# Patient Record
Sex: Male | Born: 1965 | Race: Black or African American | Hispanic: No | Marital: Single | Smoking: Former smoker
Health system: Southern US, Community
[De-identification: ages and names within clinical notes are randomized; demographics above are authoritative.]

## PROBLEM LIST (undated history)

## (undated) DIAGNOSIS — E119 Type 2 diabetes mellitus without complications: Secondary | ICD-10-CM

## (undated) DIAGNOSIS — B1920 Unspecified viral hepatitis C without hepatic coma: Secondary | ICD-10-CM

## (undated) DIAGNOSIS — I10 Essential (primary) hypertension: Secondary | ICD-10-CM

## (undated) DIAGNOSIS — C801 Malignant (primary) neoplasm, unspecified: Secondary | ICD-10-CM

## (undated) DIAGNOSIS — D649 Anemia, unspecified: Secondary | ICD-10-CM

## (undated) MED FILL — Dexamethasone Sodium Phosphate Inj 100 MG/10ML: INTRAMUSCULAR | Qty: 1 | Status: AC

---

## 2021-02-13 ENCOUNTER — Emergency Department (HOSPITAL_COMMUNITY): Payer: Self-pay

## 2021-02-13 ENCOUNTER — Other Ambulatory Visit: Payer: Self-pay

## 2021-02-13 ENCOUNTER — Telehealth: Payer: Self-pay | Admitting: Surgery

## 2021-02-13 ENCOUNTER — Encounter (HOSPITAL_COMMUNITY): Payer: Self-pay

## 2021-02-13 ENCOUNTER — Emergency Department (HOSPITAL_COMMUNITY)
Admission: EM | Admit: 2021-02-13 | Discharge: 2021-02-13 | Disposition: A | Discharge status: Home / Self Care | Payer: Self-pay | Attending: Student | Admitting: Student

## 2021-02-13 DIAGNOSIS — M545 Low back pain, unspecified: Secondary | ICD-10-CM | POA: Insufficient documentation

## 2021-02-13 DIAGNOSIS — C61 Malignant neoplasm of prostate: Secondary | ICD-10-CM | POA: Insufficient documentation

## 2021-02-13 LAB — CBC WITH DIFFERENTIAL/PLATELET
Abs Immature Granulocytes: 0.33 10*3/uL — ABNORMAL HIGH (ref 0.00–0.07)
Basophils Absolute: 0 10*3/uL (ref 0.0–0.1)
Basophils Relative: 1 %
Eosinophils Absolute: 0.1 10*3/uL (ref 0.0–0.5)
Eosinophils Relative: 1 %
HCT: 23.1 % — ABNORMAL LOW (ref 39.0–52.0)
Hemoglobin: 7.2 g/dL — ABNORMAL LOW (ref 13.0–17.0)
Immature Granulocytes: 4 %
Lymphocytes Relative: 19 %
Lymphs Abs: 1.6 10*3/uL (ref 0.7–4.0)
MCH: 25.7 pg — ABNORMAL LOW (ref 26.0–34.0)
MCHC: 31.2 g/dL (ref 30.0–36.0)
MCV: 82.5 fL (ref 80.0–100.0)
Monocytes Absolute: 0.8 10*3/uL (ref 0.1–1.0)
Monocytes Relative: 10 %
Neutro Abs: 5.2 10*3/uL (ref 1.7–7.7)
Neutrophils Relative %: 65 %
Platelets: 396 10*3/uL (ref 150–400)
RBC: 2.8 MIL/uL — ABNORMAL LOW (ref 4.22–5.81)
RDW: 14.3 % (ref 11.5–15.5)
WBC: 8 10*3/uL (ref 4.0–10.5)
nRBC: 0 % (ref 0.0–0.2)

## 2021-02-13 LAB — COMPREHENSIVE METABOLIC PANEL
ALT: 9 U/L (ref 0–44)
AST: 41 U/L (ref 15–41)
Albumin: 3.6 g/dL (ref 3.5–5.0)
Alkaline Phosphatase: 644 U/L — ABNORMAL HIGH (ref 38–126)
Anion gap: 6 (ref 5–15)
BUN: 40 mg/dL — ABNORMAL HIGH (ref 6–20)
CO2: 27 mmol/L (ref 22–32)
Calcium: 9 mg/dL (ref 8.9–10.3)
Chloride: 107 mmol/L (ref 98–111)
Creatinine, Ser: 1.56 mg/dL — ABNORMAL HIGH (ref 0.61–1.24)
GFR, Estimated: 51 mL/min — ABNORMAL LOW (ref >60)
Glucose, Bld: 116 mg/dL — ABNORMAL HIGH (ref 70–99)
Potassium: 3.5 mmol/L (ref 3.5–5.1)
Sodium: 140 mmol/L (ref 135–145)
Total Bilirubin: 0.6 mg/dL (ref 0.3–1.2)
Total Protein: 7.6 g/dL (ref 6.5–8.1)

## 2021-02-13 LAB — PSA: Prostatic Specific Antigen: 1234.14 ng/mL — ABNORMAL HIGH (ref 0.00–4.00)

## 2021-02-13 LAB — LACTATE DEHYDROGENASE: LDH: 351 U/L — ABNORMAL HIGH (ref 98–192)

## 2021-02-13 MED ORDER — FERROUS SULFATE 325 (65 FE) MG PO TABS: 325.0000 mg | ORAL_TABLET | Freq: Every day | ORAL | 0 refills | Status: AC

## 2021-02-13 MED ORDER — IOHEXOL 350 MG/ML SOLN
80.0000 mL | Freq: Once | INTRAVENOUS | Status: AC | PRN
  Administered 2021-02-13: 80 mL via INTRAVENOUS

## 2021-02-13 NOTE — ED Provider Notes (Signed)
[facility] DEPT Provider Note   CSN: [id number] Arrival date & time: 02/13/21  1122     History Chief Complaint  Patient presents with   Back Pain    Kunio Cummiskey is a 55 y.o. male.  HPI  55 year old male presents the emergency department today for evaluation of lower back pain.  Pain is located to the bilateral lower back and radiates down the bilateral lower extremities.  This is been present for several months.  He has been taking over-the-counter medications with some improvement of symptoms.  He denies any associated numbness, weakness, incontinence.  Denies any fevers, history of cancer or IV drug use.  He denies any chest pain, urinary symptoms, abdominal pain.  History reviewed. No pertinent past medical history.  There are no problems to display for this patient.   History reviewed. No pertinent surgical history.     Family History  Family history unknown: Yes    Social History   Tobacco Use   Smoking status: Never   Smokeless tobacco: Never  Vaping Use   Vaping Use: Never used  Substance Use Topics   Alcohol use: Never   Drug use: Yes    Types: Marijuana    Home Medications Prior to Admission medications   Medication Sig Start Date End Date Taking? Authorizing Provider  ferrous sulfate 325 (65 FE) MG tablet Take 1 tablet (325 mg total) by mouth daily. Start by taking one tablet every other day. Over the next 2 weeks increase your dose to once daily. 02/13/21  Yes Dashay Giesler S, PA-C    Allergies    Patient has no known allergies.  Review of Systems   Review of Systems  Constitutional:  Negative for fever.  Respiratory:  Negative for shortness of breath.   Cardiovascular:  Negative for chest pain.  Gastrointestinal:  Negative for abdominal pain.       No incontinence  Genitourinary:  Negative for dysuria and frequency.       No incontinence  Musculoskeletal:  Positive for back pain.  Neurological:   Negative for weakness and numbness.   Physical Exam Updated Vital Signs BP (!) 163/92   Pulse 92   Temp 98 F (36.7 C) (Oral)   Resp 17   Ht 6\' 3"  (1.905 m)   Wt 81.2 kg   SpO2 100%   BMI 22.37 kg/m   Physical Exam Constitutional:      General: He is not in acute distress.    Appearance: He is well-developed.  Eyes:     Conjunctiva/sclera: Conjunctivae normal.  Cardiovascular:     Rate and Rhythm: Normal rate and regular rhythm.  Pulmonary:     Effort: Pulmonary effort is normal.     Breath sounds: Normal breath sounds.  Abdominal:     General: Bowel sounds are normal.     Palpations: Abdomen is soft.     Tenderness: There is no abdominal tenderness.  Musculoskeletal:     Comments: No midline lumbar TTP. TTP noted to the bilat SI joints. 5/5 strength to the bilat lower extremities with normal sensation throughout  Skin:    General: Skin is warm and dry.  Neurological:     Mental Status: He is alert and oriented to person, place, and time.    ED Results / Procedures / Treatments   Labs (all labs ordered are listed, but only abnormal results are displayed) Labs Reviewed  CBC WITH DIFFERENTIAL/PLATELET - Abnormal; Notable for the following components:  Result Value   RBC 2.80 (*)    Hemoglobin 7.2 (*)    HCT 23.1 (*)    MCH 25.7 (*)    Abs Immature Granulocytes 0.33 (*)    All other components within normal limits  COMPREHENSIVE METABOLIC PANEL - Abnormal; Notable for the following components:   Glucose, Bld 116 (*)    BUN 40 (*)    Creatinine, Ser 1.56 (*)    Alkaline Phosphatase 644 (*)    GFR, Estimated 51 (*)    All other components within normal limits  LACTATE DEHYDROGENASE - Abnormal; Notable for the following components:   LDH 351 (*)    All other components within normal limits  PSA - Abnormal; Notable for the following components:   Prostatic Specific Antigen 1,234.14 (*)    All other components within normal limits  CEA  CANCER ANTIGEN 19-9   TESTOSTERONE, FREE, DIRECT    EKG None  Radiology DG Lumbar Spine Complete  Result Date: 02/13/2021 CLINICAL DATA:  Back pain EXAM: LUMBAR SPINE - COMPLETE 4+ VIEW COMPARISON:  None. FINDINGS: Mild narrowing of the L3-4 interspace. Moderate degenerative disc disease with vacuum phenomenon L5-S1. Normal alignment. No fracture. Sclerotic lesions in the left iliac wing and left sacral ala. IMPRESSION: 1. Negative for fracture or other acute bone abnormality. 2. Degenerative disc disease L3-4 and L5-S1 as above. 3. Sclerotic left iliac wing and right sacral lesions, possible benign bone islands but nonspecific. The Electronically Signed   By: Lucrezia Europe M.D.   On: 02/13/2021 12:44   CT PELVIS WO CONTRAST  Result Date: 02/13/2021 CLINICAL DATA:  Bilateral low back pain radiating down both legs for the past 4 months. EXAM: CT PELVIS WITHOUT CONTRAST TECHNIQUE: Multidetector CT imaging of the pelvis was performed following the standard protocol without intravenous contrast. COMPARISON:  Bilateral hip x-rays from same day. FINDINGS: Urinary Tract: Incompletely imaged severe right and moderate left hydroureter with abrupt cut off in the pelvis. Mild circumferential bladder wall thickening. Bowel:  Unremarkable visualized pelvic bowel loops. Vascular/Lymphatic: Enlarged right external iliac lymph nodes measuring up to 1.6 cm in short axis. Reproductive: Enlarged, somewhat irregular prostate gland with ill-defined bilateral seminal vesicles. Other:  None. Musculoskeletal: Multiple ill-defined sclerotic lesions in the visualized pelvis. Ill-defined lytic lesion in the L4 vertebral body mild pathologic superior endplate compression fracture. IMPRESSION: 1. Constellation of findings most consistent with metastatic prostate cancer. Local invasion of the seminal vesicles and bilateral ureteral obstruction. 2. Right external iliac nodal metastatic disease. Diffuse pelvic osseous metastatic disease. 3. Mild  pathologic superior endplate compression fracture at L4. Electronically Signed   By: Titus Dubin M.D.   On: 02/13/2021 14:37   CT ABDOMEN PELVIS W CONTRAST  Result Date: 02/13/2021 CLINICAL DATA:  Staging prostate cancer.  Weight loss. EXAM: CT ABDOMEN AND PELVIS WITH CONTRAST TECHNIQUE: Multidetector CT imaging of the abdomen and pelvis was performed using the standard protocol following bolus administration of intravenous contrast. CONTRAST:  33mL OMNIPAQUE IOHEXOL 350 MG/ML SOLN COMPARISON:  Pelvic CT scan 02/13/2021. FINDINGS: Lower chest: Few small pulmonary nodules are noted in the lung bases. The largest measures 5 mm on image 15/4. Findings certainly suspicious pulmonary metastatic disease. No pleural effusions. No pericardial effusion. Hepatobiliary: There are a few small ill-defined low-attenuation hepatic lesions which are indeterminate but somewhat suspicious for metastatic disease. MRI may be helpful for further evaluation if clinically necessary. No intrahepatic biliary dilatation. The gallbladder is unremarkable. No common bile duct dilatation. Pancreas: No mass, inflammation or  ductal dilatation. Spleen: Normal size.  No focal lesions. Adrenals/Urinary Tract: Adrenal glands are normal. Severe bilateral hydroureteronephrosis. Both ureters are obstructed in the pelvis the distally and likely involved with prostate cancer. No worrisome renal lesions. Small lower pole left renal calculus noted. Mild uniform bladder wall thickening.  No bladder mass or calculi. Stomach/Bowel: The stomach, duodenum, small bowel and colon are grossly normal. Vascular/Lymphatic: Age advanced atherosclerotic calcifications involving the aorta and iliac arteries. Retroperitoneal lymphadenopathy surrounding the aorta. The largest node on the left side measures 2.2 cm on image 24/2. Inter aortocaval lymph node on image 26/2 measures 15 mm. Several smaller nodes down along the iliac chains. Bilateral pelvic adenopathy  the. The largest right-sided node measures 22 mm on image 60/2 in the largest left node measures 12 mm on image 61/2. Reproductive: Markedly enlarged prostate gland measuring 8 cm. Other: No inguinal mass or inguinal adenopathy. No free pelvic fluid collections. Musculoskeletal: Diffuse sclerotic osseous metastatic disease mainly involving the bony pelvis and the lumbar spine. Pathologic fracture of L4 is noted. Small bilateral hip lesions are also noted. IMPRESSION: 1. Markedly enlarged prostate gland with metastatic pelvic and retroperitoneal lymphadenopathy. 2. Diffuse sclerotic osseous metastatic disease mainly involving the bony pelvis and the lumbar spine. Pathologic fracture of L4. 3. Small ill-defined low-attenuation hepatic lesions are indeterminate but suspicious for metastatic disease. 4. Severe bilateral hydroureteronephrosis likely due to involvement of the distal ureters by the prostate cancer. 5. Small pulmonary nodules at the lung bases certainly suspicious for pulmonary metastatic disease. 6. Age advanced atherosclerotic calcifications involving the aorta and iliac arteries. Aortic Atherosclerosis (ICD10-I70.0). Electronically Signed   By: Marijo Sanes M.D.   On: 02/13/2021 18:19   DG Hips Bilat W or Wo Pelvis 3-4 Views  Result Date: 02/13/2021 CLINICAL DATA:  Low back pain EXAM: DG HIP (WITH OR WITHOUT PELVIS) 3-4V BILAT COMPARISON:  None. FINDINGS: There is no evidence of hip fracture or dislocation. Early bilateral hip DJD. Sclerotic foci in the left iliac wing and left sacral ala . IMPRESSION: 1. Negative for fracture or acute bone abnormality. 2. Early bilateral hip DJD. 3. Sclerotic left iliac and sacral lesions possibly benign bone islands but nonspecific. Electronically Signed   By: Lucrezia Europe M.D.   On: 02/13/2021 13:06    Procedures Procedures   Medications Ordered in ED Medications  iohexol (OMNIPAQUE) 350 MG/ML injection 80 mL (80 mLs Intravenous Contrast Given 02/13/21  1717)    ED Course  I have reviewed the triage vital signs and the nursing notes.  Pertinent labs & imaging results that were available during my care of the patient were reviewed by me and considered in my medical decision making (see chart for details).    MDM Rules/Calculators/A&P                          Patient with back pain.  No neurological deficits and normal neuro exam.  Patient can walk but states is painful.  No loss of bowel or bladder control.  No concern for cauda equina.  No fever, night sweats, weight loss, h/o cancer, IVDU.  \\\  Given duration of sxs decision made to pursue imaging  Reviewed/interpreted imaging Xray lumbar spine/pelvis - 1. Negative for fracture or acute bone abnormality. 2. Early bilateral hip DJD. 3. Sclerotic left iliac and sacral lesions possibly benign bone islands but nonspecific. - due to nonspecific nature of abnormal bone findings, CT pelvis ordered for further characterization of the  lesions CT pelvis - 1. Constellation of findings most consistent with metastatic prostate cancer. Local invasion of the seminal vesicles and bilateral ureteral obstruction. 2. Right external iliac nodal metastatic disease. Diffuse pelvic osseous metastatic disease. 3. Mild pathologic superior endplate compression fracture at L4.  3:30 PM CONSULT with Dr. Junious Silk with urology. He recommends labs. If abnormal creatinine, then recommends CT abd/pelvis and admission. If labwork is reassuring, then he can f/u with outpatient urology.  CBC with anemia that is likely chronic though there is no other for comparison CMP with normal lfts, mildly elevated bun/cr.   CT abd/pelvis - 1. Markedly enlarged prostate gland with metastatic pelvic and retroperitoneal lymphadenopathy. 2. Diffuse sclerotic osseous metastatic disease mainly involving the bony pelvis and the lumbar spine. Pathologic fracture of L4. 3. Small ill-defined low-attenuation hepatic lesions are indeterminate but  suspicious for metastatic disease. 4. Severe bilateral hydroureteronephrosis likely due to involvement of the distal ureters by the prostate cancer. 5. Small pulmonary nodules at the lung bases certainly suspicious for pulmonary metastatic disease. 6. Age advanced atherosclerotic calcifications involving the aorta and iliac arteries. Aortic Atherosclerosis  6:42 PM CONSULT with Dr. Junious Silk who advises that if patient is admitted he will consult however it is reasonable to discharge patient with outpatient f/u given he is able to pass urine and has no other emergent lab values that warrant admission.   7:00 PM CONSULT with Dr. Lindi Adie who will have the [facility] reach out to the patient to schedule a follow up appointment   MDM: pt with back pain who was ultimately diagnosed with suspected prostate ca with mets likely to bone and potentially to the liver and lungs. Oncology and urology have been consulted and pt prefers to be discharged to f/u up. He was offered admission however he declined. I will started him on iron supplements for his anemia. Have advised on plan for f/u and advised on strict return precautions. He voices understating and is in agreement. All questions answered, pt stable for discharge.  Final Clinical Impression(s) / ED Diagnoses Final diagnoses:  Prostate cancer [facility])    Rx / DC Orders ED Discharge Orders          Ordered    ferrous sulfate 325 (65 FE) MG tablet  Daily        02/13/21 2014             Bishop Dublin 02/13/21 2017    Teressa Lower, MD 02/14/21 1815

## 2021-02-13 NOTE — Discharge Instructions (Addendum)
You will need to call [facility] on Monday to schedule an appointment for follow-up.  They will see you in the office next week.  Additionally I have referred you to the cancer center.  The cancer center will reach out to you to schedule an appointment.  Please monitor your symptoms closely, if you have any new or worsening symptoms in the meantime including any difficulty urinating, fevers or other concerning symptoms then please return to the emergency department immediately.

## 2021-02-13 NOTE — ED Notes (Signed)
Patient transported to CT 

## 2021-02-13 NOTE — ED Triage Notes (Signed)
Patient reports that he has bilateral low back pain that radiates down both legs x 4 months. 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

## 2021-02-15 ENCOUNTER — Telehealth: Payer: Self-pay | Admitting: Surgery

## 2021-02-15 NOTE — Telephone Encounter (Signed)
Received TOC consult  from EDP  concerning care coordination for PCP/ [facility] , on  ED evaluation new diagnosis prostate cancer. RNCM Contacted patient to discuss assistance with PCP and [facility], discussed [facility].  RNCM noted that Dr. Lindi Adie at [facility] have been consulted and will have scheduler reach out to patient to arrange an appointment.  Referral has been sent to [facility] internal Medicine [facility].  RNCM will follow up with patient tomorrow evening.

## 2021-02-16 LAB — CEA: CEA: 3 ng/mL (ref 0.0–4.7)

## 2021-02-16 LAB — CANCER ANTIGEN 19-9: CA 19-9: 8 U/mL (ref 0–35)

## 2021-02-17 ENCOUNTER — Telehealth: Payer: Self-pay | Admitting: Oncology

## 2021-02-17 ENCOUNTER — Other Ambulatory Visit: Payer: Self-pay

## 2021-02-17 ENCOUNTER — Telehealth: Payer: Self-pay | Admitting: Surgery

## 2021-02-17 DIAGNOSIS — C61 Malignant neoplasm of prostate: Secondary | ICD-10-CM

## 2021-02-17 LAB — TESTOSTERONE, FREE, DIRECT
Testosterone, Free: 4.5 pg/mL — ABNORMAL LOW (ref 7.2–24.0)
Testosterone, Total, LC/MS: 221.6 ng/dL — ABNORMAL LOW (ref 264.0–916.0)

## 2021-02-17 NOTE — Telephone Encounter (Signed)
Scheduled appt per 11/1 staff msg. Pt is aware of appt date and time and to arrive 15 mins prior to appt.

## 2021-02-17 NOTE — Telephone Encounter (Addendum)
ED RNCM contacted [facility] to set up follow up appointment and was informed that patient would have to bring $250 for initial visit.   Patient was not able to afford the initial cost. CM called [facility] at [facility] and was able to get an appointment scheduled for  ED follow up  Patient was updated with appointment information.  Patient is scheduled to establish care with [facility] internal Medicine Clinic 11/2.  No further ED RNCM needs identidified.

## 2021-02-18 ENCOUNTER — Ambulatory Visit: Payer: Self-pay | Admitting: Internal Medicine

## 2021-02-18 ENCOUNTER — Encounter: Payer: Self-pay | Admitting: Internal Medicine

## 2021-02-18 ENCOUNTER — Other Ambulatory Visit: Payer: Self-pay

## 2021-02-18 VITALS — BP 163/98 | HR 97 | Temp 98.1°F | Ht 75.0 in | Wt 160.0 lb

## 2021-02-18 DIAGNOSIS — Z8619 Personal history of other infectious and parasitic diseases: Secondary | ICD-10-CM | POA: Insufficient documentation

## 2021-02-18 DIAGNOSIS — I10 Essential (primary) hypertension: Secondary | ICD-10-CM

## 2021-02-18 DIAGNOSIS — E119 Type 2 diabetes mellitus without complications: Secondary | ICD-10-CM | POA: Insufficient documentation

## 2021-02-18 DIAGNOSIS — D649 Anemia, unspecified: Secondary | ICD-10-CM | POA: Insufficient documentation

## 2021-02-18 DIAGNOSIS — N179 Acute kidney failure, unspecified: Secondary | ICD-10-CM

## 2021-02-18 DIAGNOSIS — N131 Hydronephrosis with ureteral stricture, not elsewhere classified: Secondary | ICD-10-CM

## 2021-02-18 DIAGNOSIS — G8929 Other chronic pain: Secondary | ICD-10-CM | POA: Insufficient documentation

## 2021-02-18 DIAGNOSIS — M545 Low back pain, unspecified: Secondary | ICD-10-CM

## 2021-02-18 DIAGNOSIS — C61 Malignant neoplasm of prostate: Secondary | ICD-10-CM | POA: Insufficient documentation

## 2021-02-18 LAB — GLUCOSE, CAPILLARY: Glucose-Capillary: 97 mg/dL (ref 70–99)

## 2021-02-18 LAB — POCT GLYCOSYLATED HEMOGLOBIN (HGB A1C): Hemoglobin A1C: 6.8 % — AB (ref 4.0–5.6)

## 2021-02-18 NOTE — Assessment & Plan Note (Addendum)
History of uncomplicated Type 2 Diabetes dating back to 2016. He was previously treated with Metformin and Glipizide but was intolerant to Metformin due to side effects (N/V/D). At this time, he denies any frequent urination, excessive thirst. A1c today is within goal at 6.8%.   - Repeat A1c in 3-6 months  - A1c goal of 8-9%

## 2021-02-18 NOTE — Assessment & Plan Note (Addendum)
Lab worked obtained at recent ED visit demonstrated a creatinine of 1.5 with BUN of 39. Previously lab work demonstrated normal renal function. Differential includes secondary to hydronephrosis versus secondary to long standing HTN.   - Repeat BMP pending today   ADDENDUM: Creatinine improving. Will need to monitor closely as long as patient is retaining, however no need for foley at this time based on renal function

## 2021-02-18 NOTE — Assessment & Plan Note (Addendum)
BP Readings from Last 3 Encounters:  02/18/21 (!) 163/98  02/13/21 (!) 163/92   Shane Jackson has a PMHx of HTN previously treated with Lisinopril-HCTZ. He notes that he developed N/V/D when Metformin was also started at the same. He wasn't sure which medication caused it so he stopped take it after a while. He denies any SOB, chest pain (at rest or with exertion), lower extremity edema. He denies any current tobacco use or family history of heart disease.   Assessment/Plan:  Asymptomatic HTN today. Given recent diagnosis of advanced metastatic cancer complicated by severe bilateral hydroureteronephrosisand AKI, will hold off on initiating treatment as it would likely affect renal function.   - Continue to monitor closely - Counseled on return precautions

## 2021-02-18 NOTE — Progress Notes (Signed)
CC: Establish Care; T2DM; HTN; Prostate Cancer   HPI:  [name] is a 55 y.o. with a PMHx as listed below who presents to the clinic for Establish Care; T2DM; HTN; Prostate Cancer.   Please see the Encounters tab for problem-based Assessment & Plan regarding status of patient's acute and chronic conditions.  Past Medical History:  HTN T2DM Hepatitis C Genotype 1b Metastatic Prostate cancer (recent diagnosis)  Past Surgical History:  None   Family History:  No family history of any malignancy  Brother with HTN No family history of diabetes   Social History:  Lives in [place] and independent in all ADLs and IADLs Denies any current or previous tobacco, drug or alcohol use  Social support includes his family, however both his parents are deceased.   Review of Systems: Review of Systems  Constitutional:  Positive for weight loss. Negative for chills, fever and malaise/fatigue.  HENT:  Negative for congestion and sore throat.   Eyes:  Negative for blurred vision and double vision.  Respiratory:  Negative for shortness of breath and wheezing.   Cardiovascular:  Negative for chest pain, palpitations and leg swelling.  Gastrointestinal:  Positive for melena (one dark (not black) stool last week). Negative for abdominal pain, constipation, diarrhea, nausea and vomiting.  Genitourinary:  Negative for dysuria, flank pain, frequency, hematuria and urgency.       No straining with urination  Musculoskeletal:  Positive for back pain and myalgias. Negative for falls, joint pain and neck pain.  Skin:  Negative for itching and rash.  Neurological:  Negative for dizziness, focal weakness, weakness and headaches.  Psychiatric/Behavioral:  Negative for depression, substance abuse and suicidal ideas. The patient is not nervous/anxious and does not have insomnia.    Physical Exam:  Vitals:   02/18/21 1315  BP: (!) 163/98  Pulse: 97  Temp: 98.1 F (36.7 C)  TempSrc: Oral   SpO2: 100%  Weight: 160 lb (72.6 kg)  Height: 6\' 3"  (1.905 m)   Physical Exam Constitutional:      General: He is not in acute distress.    Appearance: He is normal weight.  HENT:     Head: Normocephalic and atraumatic.     Mouth/Throat:     Mouth: Mucous membranes are moist.     Pharynx: Oropharynx is clear.  Eyes:     Extraocular Movements: Extraocular movements intact.     Conjunctiva/sclera: Conjunctivae normal.     Pupils: Pupils are equal, round, and reactive to light.  Cardiovascular:     Rate and Rhythm: Normal rate and regular rhythm.     Heart sounds: No murmur heard.   No gallop.  Pulmonary:     Effort: Pulmonary effort is normal. No respiratory distress.     Breath sounds: Normal breath sounds. No wheezing, rhonchi or rales.  Abdominal:     General: Abdomen is scaphoid. Bowel sounds are normal. There is no distension.     Palpations: Abdomen is soft. There is no mass.     Tenderness: There is no abdominal tenderness. There is no right CVA tenderness, left CVA tenderness or guarding.  Musculoskeletal:        General: No signs of injury.     Right lower leg: No edema.     Left lower leg: No edema.  Skin:    General: Skin is warm and dry.     Coloration: Skin is not jaundiced or pale.     Findings: No erythema or rash.  Neurological:     Mental Status: He is alert and oriented to person, place, and time. Mental status is at baseline.     Sensory: No sensory deficit.     Motor: No weakness.     Coordination: Coordination normal.     Gait: Gait normal.  Psychiatric:        Mood and Affect: Mood normal.        Behavior: Behavior normal.        Thought Content: Thought content normal.        Judgment: Judgment normal.    Assessment & Plan:   See Encounters Tab for problem based charting.  Patient discussed with Dr. Evette Doffing

## 2021-02-18 NOTE — Assessment & Plan Note (Addendum)
Previous history of uncomplicated Hepatitis C treated with Harvoni for 8 weeks, completed in 02/2015. No post-treatment RNA quant obtained to ensure SVR. Given potential need for immunosuppressive agents in the future given new diagnosis of cancer, will obtain quant today.   - HCV RNA quant pending  ADDENDUM: HCV RNA undetectable consistent with SVR.

## 2021-02-18 NOTE — Assessment & Plan Note (Addendum)
Shane Jackson denies any dizziness, palpitations, difficulty breathing. He denies any hematuria, hematochezia, hematemesis. He did notice one dark bowel movement last week but states it was not black, just darker than usual. He has not started daily iron supplements yet as was recommended in the ED but plans to do so today.   Assessment/Plan: Asymptomatic severe normocytic anemia. Suspect in the setting of malignancy with bony involvement.   - Repeat CBC to trend hemoglobin today - Ferritin, iron panel pending  - Recommended continued daily OTC iron supplementation   ADDENDUM: Hemoglobin remained stable at 7.3. Based on iron panel, suspect this is secondary to bony infiltration and anemia of chronic disease. Counseled to discontinue iron supplementation for now.

## 2021-02-18 NOTE — Assessment & Plan Note (Signed)
Mr. Shane Jackson states he has lower back pain for the last 4 months. He is not worse during a particular part of the day and essentially all positions aggravate his pain. He denies any focal leg weakness or numbness but occasionally he feels like his muscles are tight. He denies any difficulty with walking or performing his ADLs secondary to pain. Pain is described as more of a nuisance.   Assessment/Plan: Chronic back pain secondary to metastatic disease with L4 fracture seen on imaging. No evidence of spinal involvement on examination. Will treat conservatively for now.   - Recommended Tylenol PRN for pain

## 2021-02-18 NOTE — Assessment & Plan Note (Signed)
Shane Jackson denies any known previous history of prostate cancer or family history of prostate cancer.   Assessment/Plan:  Recent ED visit for back pain with results significant for advanced prostate cancer with metastasis to the bone, liver and lung. PSA elevated > 1200. Oncology and urology appointments are set.   - Follow up Oncology recommendations

## 2021-02-18 NOTE — Patient Instructions (Addendum)
It was nice seeing you today! Thank you for choosing [facility] for your Primary Care.    Today we talked about:   Diabetes: We will check your A1c today to see how your diabetes is doing. Our goal A1c for you is 8-9%. If below this, we will not start treatment for now.   High blood pressure: Your blood pressure was a bit high today. We will hold off on starting treatment for now.   I am checking your Hepatitis C levels to ensure treatment was successful.   Back Pain:  I recommend Tylenol 1000 mg every 6 to 8 hours, as needed for back pain. If this is not successful for you, please let me know.   Prostate Cancer:  I am checking blood work regarding your kidneys and blood counts. I will call you with the results If you cannot urinate and feel the need to, please contact our office or the urology office immediately.

## 2021-02-18 NOTE — Assessment & Plan Note (Signed)
Shane Jackson denies any difficult urinating or interrupted urine stream. He denies any straining with urination.   Assessment/Plan: Recent CT imaging with severe bilateral hydronephrosis secondary to bilateral ureter obstruction in the setting of metastatic  prostate cancer. BMP with evidence of AKI.   - Counseled extensively on monitoring for signs of urinary obstruction. Patient understands need to contact our office or go to the ED emergently should these symptoms occur

## 2021-02-19 NOTE — Progress Notes (Signed)
Internal Medicine Clinic Attending  Case discussed with Dr. Basaraba  At the time of the visit.  We reviewed the resident's history and exam and pertinent patient test results.  I agree with the assessment, diagnosis, and plan of care documented in the resident's note.  

## 2021-02-20 ENCOUNTER — Inpatient Hospital Stay: Payer: Self-pay | Attending: Oncology | Admitting: Oncology

## 2021-02-20 ENCOUNTER — Other Ambulatory Visit: Payer: Self-pay

## 2021-02-20 VITALS — BP 167/98 | HR 102 | Temp 97.7°F | Resp 20 | Wt 162.0 lb

## 2021-02-20 DIAGNOSIS — C61 Malignant neoplasm of prostate: Secondary | ICD-10-CM | POA: Insufficient documentation

## 2021-02-20 DIAGNOSIS — Z5111 Encounter for antineoplastic chemotherapy: Secondary | ICD-10-CM | POA: Insufficient documentation

## 2021-02-20 DIAGNOSIS — E291 Testicular hypofunction: Secondary | ICD-10-CM

## 2021-02-20 DIAGNOSIS — C787 Secondary malignant neoplasm of liver and intrahepatic bile duct: Secondary | ICD-10-CM | POA: Insufficient documentation

## 2021-02-20 DIAGNOSIS — Z79899 Other long term (current) drug therapy: Secondary | ICD-10-CM | POA: Insufficient documentation

## 2021-02-20 DIAGNOSIS — D630 Anemia in neoplastic disease: Secondary | ICD-10-CM

## 2021-02-20 DIAGNOSIS — C7951 Secondary malignant neoplasm of bone: Secondary | ICD-10-CM | POA: Insufficient documentation

## 2021-02-20 DIAGNOSIS — M4856XA Collapsed vertebra, not elsewhere classified, lumbar region, initial encounter for fracture: Secondary | ICD-10-CM

## 2021-02-20 DIAGNOSIS — E119 Type 2 diabetes mellitus without complications: Secondary | ICD-10-CM

## 2021-02-20 DIAGNOSIS — C772 Secondary and unspecified malignant neoplasm of intra-abdominal lymph nodes: Secondary | ICD-10-CM

## 2021-02-20 DIAGNOSIS — N1330 Unspecified hydronephrosis: Secondary | ICD-10-CM

## 2021-02-20 DIAGNOSIS — I10 Essential (primary) hypertension: Secondary | ICD-10-CM

## 2021-02-20 LAB — IRON AND TIBC
Iron Saturation: 16 % (ref 15–55)
Iron: 33 ug/dL — ABNORMAL LOW (ref 38–169)
Total Iron Binding Capacity: 208 ug/dL — ABNORMAL LOW (ref 250–450)
UIBC: 175 ug/dL (ref 111–343)

## 2021-02-20 LAB — CBC WITH DIFFERENTIAL/PLATELET
Basophils Absolute: 0.2 10*3/uL (ref 0.0–0.2)
Basos: 2 %
EOS (ABSOLUTE): 0.1 10*3/uL (ref 0.0–0.4)
Eos: 1 %
Hematocrit: 22.9 % — ABNORMAL LOW (ref 37.5–51.0)
Hemoglobin: 7.3 g/dL — ABNORMAL LOW (ref 13.0–17.7)
Lymphocytes Absolute: 1.8 10*3/uL (ref 0.7–3.1)
Lymphs: 19 %
MCH: 25.3 pg — ABNORMAL LOW (ref 26.6–33.0)
MCHC: 31.9 g/dL (ref 31.5–35.7)
MCV: 79 fL (ref 79–97)
Monocytes Absolute: 0.2 10*3/uL (ref 0.1–0.9)
Monocytes: 2 %
Neutrophils Absolute: 7.1 10*3/uL — ABNORMAL HIGH (ref 1.4–7.0)
Neutrophils: 75 %
Platelets: 423 10*3/uL (ref 150–450)
RBC: 2.89 x10E6/uL — ABNORMAL LOW (ref 4.14–5.80)
RDW: 14.2 % (ref 11.6–15.4)
WBC: 9.4 10*3/uL (ref 3.4–10.8)

## 2021-02-20 LAB — BMP8+ANION GAP
Anion Gap: 14 mmol/L (ref 10.0–18.0)
BUN/Creatinine Ratio: 17 (ref 9–20)
BUN: 22 mg/dL (ref 6–24)
CO2: 23 mmol/L (ref 20–29)
Calcium: 9.4 mg/dL (ref 8.7–10.2)
Chloride: 102 mmol/L (ref 96–106)
Creatinine, Ser: 1.29 mg/dL — ABNORMAL HIGH (ref 0.76–1.27)
Glucose: 98 mg/dL (ref 70–99)
Potassium: 4.6 mmol/L (ref 3.5–5.2)
Sodium: 139 mmol/L (ref 134–144)
eGFR: 65 mL/min/{1.73_m2} (ref >59)

## 2021-02-20 LAB — IMMATURE CELLS: Metamyelocytes: 1 % — ABNORMAL HIGH (ref 0–0)

## 2021-02-20 LAB — HCV RNA QUANT: Hepatitis C Quantitation: NOT DETECTED IU/mL

## 2021-02-20 LAB — FERRITIN: Ferritin: 2574 ng/mL — ABNORMAL HIGH (ref 30–400)

## 2021-02-20 MED ORDER — LIDOCAINE-PRILOCAINE 2.5-2.5 % EX CREA: 1.0000 "application " | TOPICAL_CREAM | CUTANEOUS | 0 refills | Status: DC | PRN

## 2021-02-20 MED ORDER — PROCHLORPERAZINE MALEATE 10 MG PO TABS
10.0000 mg | ORAL_TABLET | Freq: Four times a day (QID) | ORAL | 0 refills | Status: DC | PRN
Start: 2021-02-20 — End: 2021-03-06

## 2021-02-20 NOTE — Progress Notes (Signed)
Reason for the request:   Prostate cancer  HPI: I was asked by Dr. Debe Coder to evaluate Shane Jackson for the evaluation of prostate cancer.  He is a 55 year old man with history of hypertension and diabetes and has not been follow-up regularly for medical care.  He presented to the emergency department on February 13, 2021 with complaints of back pain.  His evaluation at that time showed a elevated alkaline phosphatase of 644.  His PSA was 1234.14.  Imaging studies at that time showed marked enlarged prostate gland with a metastatic disease to the retroperitoneal adenopathy.  He has diffuse sclerotic lesion involving the lumbar spine with pathological fracture of L4.  He has bilateral hydronephrosis due to involvement of distal ureters with prostate cancer.  Patient referred from the emergency department to urology and oncology.    Clinically, he feels reasonably fair at this time with the back pain overall manageable.  He denies any nausea, vomiting or abdominal pain.  He has reported some weight loss.  His performance status remained reasonable without any decline.  He denies any neurological deficits.  He does not report any headaches, blurry vision, syncope or seizures. Does not report any fevers, chills or sweats.  Does not report any cough, wheezing or hemoptysis.  Does not report any chest pain, palpitation, orthopnea or leg edema.  Does not report any nausea, vomiting or abdominal pain.  Does not report any constipation or diarrhea.  Does not report any skeletal complaints.    Does not report frequency, urgency or hematuria.  Does not report any skin rashes or lesions. Does not report any heat or cold intolerance.  Does not report any lymphadenopathy or petechiae.  Does not report any anxiety or depression.  Remaining review of systems is negative.    Past medical history significant for type 2 diabetes and hypertension but no other comorbid conditions.  Current Outpatient Medications:    ferrous  sulfate 325 (65 FE) MG tablet, Take 1 tablet (325 mg total) by mouth daily. Start by taking one tablet every other day. Over the next 2 weeks increase your dose to once daily., Disp: 30 tablet, Rfl: 0:  No Known Allergies:   Family History  Family history unknown: Yes  :   Social History   Socioeconomic History   Marital status: Single    Spouse name: Not on file   Number of children: Not on file   Years of education: Not on file   Highest education level: Not on file  Occupational History   Not on file  Tobacco Use   Smoking status: Never   Smokeless tobacco: Never  Vaping Use   Vaping Use: Never used  Substance and Sexual Activity   Alcohol use: Never   Drug use: Yes    Types: Marijuana   Sexual activity: Not on file  Other Topics Concern   Not on file  Social History Narrative   Not on file   Social Determinants of Health   Financial Resource Strain: Not on file  Food Insecurity: Not on file  Transportation Needs: Not on file  Physical Activity: Not on file  Stress: Not on file  Social Connections: Not on file  Intimate Partner Violence: Not on file  :  Pertinent items are noted in HPI.  Exam: Blood pressure (!) 167/98, pulse (!) 102, temperature 97.7 F (36.5 C), resp. rate 20, weight 162 lb (73.5 kg), SpO2 100 %. ECOG 1 General appearance: alert and cooperative appeared without distress. Head:  atraumatic without any abnormalities. Eyes: conjunctivae/corneas clear. PERRL.  Sclera anicteric. Throat: lips, mucosa, and tongue normal; without oral thrush or ulcers. Resp: clear to auscultation bilaterally without rhonchi, wheezes or dullness to percussion. Cardio: regular rate and rhythm, S1, S2 normal, no murmur, click, rub or gallop GI: soft, non-tender; bowel sounds normal; no masses,  no organomegaly Skin: Skin color, texture, turgor normal. No rashes or lesions Lymph nodes: Cervical, supraclavicular, and axillary nodes normal. Neurologic: Grossly  normal without any motor, sensory or deep tendon reflexes. Musculoskeletal: No joint deformity or effusion.   Recent Labs    02/18/21 1420  WBC 9.4  HGB 7.3*  HCT 22.9*  PLT 423    Recent Labs    02/18/21 1420  NA 139  K 4.6  CL 102  CO2 23  GLUCOSE 98  BUN 22  CREATININE 1.29*  CALCIUM 9.4      DG Lumbar Spine Complete  Result Date: 02/13/2021 CLINICAL DATA:  Back pain EXAM: LUMBAR SPINE - COMPLETE 4+ VIEW COMPARISON:  None. FINDINGS: Mild narrowing of the L3-4 interspace. Moderate degenerative disc disease with vacuum phenomenon L5-S1. Normal alignment. No fracture. Sclerotic lesions in the left iliac wing and left sacral ala. IMPRESSION: 1. Negative for fracture or other acute bone abnormality. 2. Degenerative disc disease L3-4 and L5-S1 as above. 3. Sclerotic left iliac wing and right sacral lesions, possible benign bone islands but nonspecific. The Electronically Signed   By: Shane Jackson M.D.   On: 02/13/2021 12:44   CT PELVIS WO CONTRAST  Result Date: 02/13/2021 CLINICAL DATA:  Bilateral low back pain radiating down both legs for the past 4 months. EXAM: CT PELVIS WITHOUT CONTRAST TECHNIQUE: Multidetector CT imaging of the pelvis was performed following the standard protocol without intravenous contrast. COMPARISON:  Bilateral hip x-rays from same day. FINDINGS: Urinary Tract: Incompletely imaged severe right and moderate left hydroureter with abrupt cut off in the pelvis. Mild circumferential bladder wall thickening. Bowel:  Unremarkable visualized pelvic bowel loops. Vascular/Lymphatic: Enlarged right external iliac lymph nodes measuring up to 1.6 cm in short axis. Reproductive: Enlarged, somewhat irregular prostate gland with ill-defined bilateral seminal vesicles. Other:  None. Musculoskeletal: Multiple ill-defined sclerotic lesions in the visualized pelvis. Ill-defined lytic lesion in the L4 vertebral body mild pathologic superior endplate compression fracture.  IMPRESSION: 1. Constellation of findings most consistent with metastatic prostate cancer. Local invasion of the seminal vesicles and bilateral ureteral obstruction. 2. Right external iliac nodal metastatic disease. Diffuse pelvic osseous metastatic disease. 3. Mild pathologic superior endplate compression fracture at L4. Electronically Signed   By: Shane Jackson M.D.   On: 02/13/2021 14:37   CT ABDOMEN PELVIS W CONTRAST  Result Date: 02/13/2021 CLINICAL DATA:  Staging prostate cancer.  Weight loss. EXAM: CT ABDOMEN AND PELVIS WITH CONTRAST TECHNIQUE: Multidetector CT imaging of the abdomen and pelvis was performed using the standard protocol following bolus administration of intravenous contrast. CONTRAST:  46mL OMNIPAQUE IOHEXOL 350 MG/ML SOLN COMPARISON:  Pelvic CT scan 02/13/2021. FINDINGS: Lower chest: Few small pulmonary nodules are noted in the lung bases. The largest measures 5 mm on image 15/4. Findings certainly suspicious pulmonary metastatic disease. No pleural effusions. No pericardial effusion. Hepatobiliary: There are a few small ill-defined low-attenuation hepatic lesions which are indeterminate but somewhat suspicious for metastatic disease. MRI may be helpful for further evaluation if clinically necessary. No intrahepatic biliary dilatation. The gallbladder is unremarkable. No common bile duct dilatation. Pancreas: No mass, inflammation or ductal dilatation. Spleen: Normal size.  No  focal lesions. Adrenals/Urinary Tract: Adrenal glands are normal. Severe bilateral hydroureteronephrosis. Both ureters are obstructed in the pelvis the distally and likely involved with prostate cancer. No worrisome renal lesions. Small lower pole left renal calculus noted. Mild uniform bladder wall thickening.  No bladder mass or calculi. Stomach/Bowel: The stomach, duodenum, small bowel and colon are grossly normal. Vascular/Lymphatic: Age advanced atherosclerotic calcifications involving the aorta and iliac  arteries. Retroperitoneal lymphadenopathy surrounding the aorta. The largest node on the left side measures 2.2 cm on image 24/2. Inter aortocaval lymph node on image 26/2 measures 15 mm. Several smaller nodes down along the iliac chains. Bilateral pelvic adenopathy the. The largest right-sided node measures 22 mm on image 60/2 in the largest left node measures 12 mm on image 61/2. Reproductive: Markedly enlarged prostate gland measuring 8 cm. Other: No inguinal mass or inguinal adenopathy. No free pelvic fluid collections. Musculoskeletal: Diffuse sclerotic osseous metastatic disease mainly involving the bony pelvis and the lumbar spine. Pathologic fracture of L4 is noted. Small bilateral hip lesions are also noted. IMPRESSION: 1. Markedly enlarged prostate gland with metastatic pelvic and retroperitoneal lymphadenopathy. 2. Diffuse sclerotic osseous metastatic disease mainly involving the bony pelvis and the lumbar spine. Pathologic fracture of L4. 3. Small ill-defined low-attenuation hepatic lesions are indeterminate but suspicious for metastatic disease. 4. Severe bilateral hydroureteronephrosis likely due to involvement of the distal ureters by the prostate cancer. 5. Small pulmonary nodules at the lung bases certainly suspicious for pulmonary metastatic disease. 6. Age advanced atherosclerotic calcifications involving the aorta and iliac arteries. Aortic Atherosclerosis (ICD10-I70.0). Electronically Signed   By: Shane Jackson M.D.   On: 02/13/2021 18:19   DG Hips Bilat W or Wo Pelvis 3-4 Views  Result Date: 02/13/2021 CLINICAL DATA:  Low back pain EXAM: DG HIP (WITH OR WITHOUT PELVIS) 3-4V BILAT COMPARISON:  None. FINDINGS: There is no evidence of hip fracture or dislocation. Early bilateral hip DJD. Sclerotic foci in the left iliac wing and left sacral ala . IMPRESSION: 1. Negative for fracture or acute bone abnormality. 2. Early bilateral hip DJD. 3. Sclerotic left iliac and sacral lesions possibly  benign bone islands but nonspecific. Electronically Signed   By: Shane Jackson M.D.   On: 02/13/2021 13:06    Assessment and Plan:     55 year old with:  1.  Advanced malignancy with elevated alkaline phosphatase, enlarged prostate and involvement in the bone, liver and possible lung metastasis.  He presented with a PSA of over 1200 suspicious for castration-sensitive advanced prostate cancer.  The natural course of this disease was discussed at this time.  Treatment options were reiterated.  Androgen deprivation therapy remains the main treatment option at this time and will initiate in the near future.  Complications such as weight gain, hot flashes and sexual dysfunction were reiterated.  This will be started after confirming his diagnosis which is scheduled for urology evaluation next week.  The role for any additional therapy were discussed at this time.  Given his age, high-volume, high risk disease I have recommended proceeding with Taxotere chemotherapy in addition to androgen deprivation.  Complication associated with this treatment include nausea, vomiting, myelosuppression and peripheral neuropathy.  He would be a candidate for triple therapy at this time with the addition of androgen synthesis pathway inhibitors which will be potentially added at a later date after we initiate the initial androgen deprivation and chemotherapy treatment.  He is agreeable to proceed and I will schedule chemo education class in the near future.  I  will complete his staging work-up with a PSMA PET scan as well.  2.  Androgen deprivation: We will start with Mills Koller and transition to [place] in the future.  Treatment is indefinite and complications were reiterated.  3.  IV access: Risks and benefits of Port-A-Cath insertion were discussed at this time.  Complications that include thrombosis and bleeding and infection.  He is agreeable to proceed.  4.  Antiemetics: Prescription for Compazine will be made  available to him.  5.  Bone directed therapy: Recommended starting calcium and vitamin D supplements in the near future.  Delton See will be considered after obtaining dental clearance.  6.  Prognosis and goals of care: Therapy is palliative although aggressive measures are warranted given his young age and performance status.  7.  Anemia: Related to chronic disease with increase in her ferritin and decreased TIBC.  I do not recommend any additional iron treatment at this time.  His hemoglobin is 7.3 and does not require any transfusion at this time.  8.  Hydronephrosis: Related to his prostate cancer and he will follow-up with urology in the near future.  9.  Follow-up: We will be in the next few weeks to follow his progress.  80  minutes were dedicated to this visit. The time was spent on reviewing laboratory data, imaging studies, discussing treatment options, addressing multiple complications related to cancer and cancer therapy and answering questions regarding future plan.      A copy of this consult has been forwarded to the requesting physician.

## 2021-02-20 NOTE — Progress Notes (Signed)
START ON PATHWAY REGIMEN - Prostate     Abiraterone/prednisone: A cycle is every 28 days:     Abiraterone acetate (conventional)      Prednisone    Docetaxel cycles 1 through 6: A cycle is every 21 days:     Docetaxel      [id number]   **Always confirm dose/schedule in your pharmacy ordering system**  Patient Characteristics: Adenocarcinoma, Recurrent/New Systemic Disease, Non-Castrate, M1 Histology: Adenocarcinoma Therapeutic Status: Recurrent/New Systemic Disease Intent of Therapy: Non-Curative / Palliative Intent, Discussed with Patient

## 2021-02-20 NOTE — Progress Notes (Signed)
Introduced myself to patient as the prostate nurse navigator and discussed my role.  He is here to discuss his treatment options with Dr. Alen Blew. Patient presented to ED on 10/28 with back pain, and was diagnosed with metastatic prostate cancer.  Informed patient I would follow up with him after his visit with Dr. Alen Blew to discuss any possible barriers I could assist with.  I gave him my business card and asked him to call me with questions or concerns.  Verbalized understanding.

## 2021-03-03 ENCOUNTER — Other Ambulatory Visit: Payer: Self-pay | Admitting: Internal Medicine

## 2021-03-03 ENCOUNTER — Other Ambulatory Visit: Payer: Self-pay | Admitting: Radiology

## 2021-03-03 NOTE — Progress Notes (Signed)
RN following up regarding PSMA scan pending scheduling.  Awaiting response from Nuclear Medicine.   RN left message for patient to call back.

## 2021-03-04 ENCOUNTER — Other Ambulatory Visit: Payer: Self-pay

## 2021-03-04 ENCOUNTER — Encounter (HOSPITAL_COMMUNITY): Payer: Self-pay

## 2021-03-04 ENCOUNTER — Ambulatory Visit (HOSPITAL_COMMUNITY)
Admission: RE | Admit: 2021-03-04 | Discharge: 2021-03-04 | Disposition: A | Discharge status: Home / Self Care | Payer: Self-pay | Source: Ambulatory Visit | Attending: Oncology | Admitting: Oncology

## 2021-03-04 ENCOUNTER — Other Ambulatory Visit: Payer: Self-pay | Admitting: Oncology

## 2021-03-04 ENCOUNTER — Encounter: Payer: Self-pay | Admitting: Oncology

## 2021-03-04 DIAGNOSIS — C61 Malignant neoplasm of prostate: Secondary | ICD-10-CM | POA: Insufficient documentation

## 2021-03-04 DIAGNOSIS — E119 Type 2 diabetes mellitus without complications: Secondary | ICD-10-CM | POA: Insufficient documentation

## 2021-03-04 DIAGNOSIS — I10 Essential (primary) hypertension: Secondary | ICD-10-CM | POA: Insufficient documentation

## 2021-03-04 HISTORY — DX: Unspecified viral hepatitis C without hepatic coma: B19.20

## 2021-03-04 HISTORY — DX: Type 2 diabetes mellitus without complications: E11.9

## 2021-03-04 HISTORY — PX: IR IMAGING GUIDED PORT INSERTION: IMG5740

## 2021-03-04 HISTORY — DX: Essential (primary) hypertension: I10

## 2021-03-04 HISTORY — DX: Anemia, unspecified: D64.9

## 2021-03-04 HISTORY — DX: Malignant (primary) neoplasm, unspecified: C80.1

## 2021-03-04 LAB — GLUCOSE, CAPILLARY: Glucose-Capillary: 95 mg/dL (ref 70–99)

## 2021-03-04 MED ORDER — HEPARIN SOD (PORK) LOCK FLUSH 100 UNIT/ML IV SOLN
INTRAVENOUS | Status: AC
  Filled 2021-03-04: qty 5

## 2021-03-04 MED ORDER — MIDAZOLAM HCL 2 MG/2ML IJ SOLN
INTRAMUSCULAR | Status: AC
  Filled 2021-03-04: qty 4

## 2021-03-04 MED ORDER — FENTANYL CITRATE (PF) 100 MCG/2ML IJ SOLN
INTRAMUSCULAR | Status: AC | PRN
  Administered 2021-03-04: 50 ug via INTRAVENOUS

## 2021-03-04 MED ORDER — MIDAZOLAM HCL 2 MG/2ML IJ SOLN
INTRAMUSCULAR | Status: AC | PRN
  Administered 2021-03-04: 2 mg via INTRAVENOUS
  Administered 2021-03-04: 1 mg via INTRAVENOUS

## 2021-03-04 MED ORDER — FENTANYL CITRATE (PF) 100 MCG/2ML IJ SOLN
INTRAMUSCULAR | Status: AC
  Filled 2021-03-04: qty 2

## 2021-03-04 MED ORDER — HEPARIN SOD (PORK) LOCK FLUSH 100 UNIT/ML IV SOLN
INTRAVENOUS | Status: AC | PRN
  Administered 2021-03-04: 500 [IU] via INTRAVENOUS

## 2021-03-04 MED ORDER — LIDOCAINE-EPINEPHRINE (PF) 1 %-1:200000 IJ SOLN
INTRAMUSCULAR | Status: AC | PRN
  Administered 2021-03-04: 20 mL

## 2021-03-04 MED ORDER — LIDOCAINE-EPINEPHRINE (PF) 2 %-1:200000 IJ SOLN
INTRAMUSCULAR | Status: AC
  Filled 2021-03-04: qty 20

## 2021-03-04 MED ORDER — SODIUM CHLORIDE 0.9 % IV SOLN: INTRAVENOUS | Status: DC

## 2021-03-04 NOTE — Consult Note (Addendum)
Chief Complaint: Patient was seen in consultation today for Port-A-Cath placement  Referring Physician(s): Wyatt Portela  Supervising Physician: Daryll Brod  Patient Status: Shane Jackson  History of Present Illness: Shane Jackson is a 55 y.o. male with history of diabetes, hepatitis C, hypertension and advanced prostate carcinoma who presents today for Port-A-Cath placement to assist with treatment.   No past medical history on file.  No past surgical history on file.  Allergies: Patient has no known allergies.  Medications: Prior to Admission medications   Medication Sig Start Date End Date Taking? Authorizing Provider  ferrous sulfate 325 (65 FE) MG tablet Take 1 tablet (325 mg total) by mouth daily. Start by taking one tablet every other day. Over the next 2 weeks increase your dose to once daily. Patient not taking: Reported on 02/20/2021 02/13/21   Couture, Cortni S, PA-C  lidocaine-prilocaine (EMLA) cream Apply 1 application topically as needed. 02/20/21   Wyatt Portela, MD  prochlorperazine (COMPAZINE) 10 MG tablet Take 1 tablet (10 mg total) by mouth every 6 (six) hours as needed for nausea or vomiting. 02/20/21   Wyatt Portela, MD     Family History  Family history unknown: Yes    Social History   Socioeconomic History   Marital status: Single    Spouse name: Not on file   Number of children: Not on file   Years of education: Not on file   Highest education level: Not on file  Occupational History   Not on file  Tobacco Use   Smoking status: Never   Smokeless tobacco: Never  Vaping Use   Vaping Use: Never used  Substance and Sexual Activity   Alcohol use: Never   Drug use: Yes    Types: Marijuana   Sexual activity: Not on file  Other Topics Concern   Not on file  Social History Narrative   Not on file   Social Determinants of Health   Financial Resource Strain: Not on file  Food Insecurity: Not on file  Transportation Needs: Not  on file  Physical Activity: Not on file  Stress: Not on file  Social Connections: Not on file      Review of Systems currently denies fever, headache, chest pain, dyspnea, cough, abdominal/back pain, nausea, vomiting or bleeding.  Vital Signs: BP (!) 163/114   Pulse (!) 103   Temp 98.5 F (36.9 C) (Oral)   Resp 18   SpO2 99%   Physical Exam awake, alert.  Chest clear to auscultation bilaterally.  Heart with regular rate and rhythm.  Abdomen soft, positive bowel sounds, nontender.  No lower extremity edema.  Imaging: DG Lumbar Spine Complete  Result Date: 02/13/2021 CLINICAL DATA:  Back pain EXAM: LUMBAR SPINE - COMPLETE 4+ VIEW COMPARISON:  None. FINDINGS: Mild narrowing of the L3-4 interspace. Moderate degenerative disc disease with vacuum phenomenon L5-S1. Normal alignment. No fracture. Sclerotic lesions in the left iliac wing and left sacral ala. IMPRESSION: 1. Negative for fracture or other acute bone abnormality. 2. Degenerative disc disease L3-4 and L5-S1 as above. 3. Sclerotic left iliac wing and right sacral lesions, possible benign bone islands but nonspecific. The Electronically Signed   By: Lucrezia Europe M.D.   On: 02/13/2021 12:44   CT PELVIS WO CONTRAST  Result Date: 02/13/2021 CLINICAL DATA:  Bilateral low back pain radiating down both legs for the past 4 months. EXAM: CT PELVIS WITHOUT CONTRAST TECHNIQUE: Multidetector CT imaging of the pelvis was performed following the  standard protocol without intravenous contrast. COMPARISON:  Bilateral hip x-rays from same day. FINDINGS: Urinary Tract: Incompletely imaged severe right and moderate left hydroureter with abrupt cut off in the pelvis. Mild circumferential bladder wall thickening. Bowel:  Unremarkable visualized pelvic bowel loops. Vascular/Lymphatic: Enlarged right external iliac lymph nodes measuring up to 1.6 cm in short axis. Reproductive: Enlarged, somewhat irregular prostate gland with ill-defined bilateral seminal  vesicles. Other:  None. Musculoskeletal: Multiple ill-defined sclerotic lesions in the visualized pelvis. Ill-defined lytic lesion in the L4 vertebral body mild pathologic superior endplate compression fracture. IMPRESSION: 1. Constellation of findings most consistent with metastatic prostate cancer. Local invasion of the seminal vesicles and bilateral ureteral obstruction. 2. Right external iliac nodal metastatic disease. Diffuse pelvic osseous metastatic disease. 3. Mild pathologic superior endplate compression fracture at L4. Electronically Signed   By: Titus Dubin M.D.   On: 02/13/2021 14:37   CT ABDOMEN PELVIS W CONTRAST  Result Date: 02/13/2021 CLINICAL DATA:  Staging prostate cancer.  Weight loss. EXAM: CT ABDOMEN AND PELVIS WITH CONTRAST TECHNIQUE: Multidetector CT imaging of the abdomen and pelvis was performed using the standard protocol following bolus administration of intravenous contrast. CONTRAST:  17mL OMNIPAQUE IOHEXOL 350 MG/ML SOLN COMPARISON:  Pelvic CT scan 02/13/2021. FINDINGS: Lower chest: Few small pulmonary nodules are noted in the lung bases. The largest measures 5 mm on image 15/4. Findings certainly suspicious pulmonary metastatic disease. No pleural effusions. No pericardial effusion. Hepatobiliary: There are a few small ill-defined low-attenuation hepatic lesions which are indeterminate but somewhat suspicious for metastatic disease. MRI may be helpful for further evaluation if clinically necessary. No intrahepatic biliary dilatation. The gallbladder is unremarkable. No common bile duct dilatation. Pancreas: No mass, inflammation or ductal dilatation. Spleen: Normal size.  No focal lesions. Adrenals/Urinary Tract: Adrenal glands are normal. Severe bilateral hydroureteronephrosis. Both ureters are obstructed in the pelvis the distally and likely involved with prostate cancer. No worrisome renal lesions. Small lower pole left renal calculus noted. Mild uniform bladder wall  thickening.  No bladder mass or calculi. Stomach/Bowel: The stomach, duodenum, small bowel and colon are grossly normal. Vascular/Lymphatic: Age advanced atherosclerotic calcifications involving the aorta and iliac arteries. Retroperitoneal lymphadenopathy surrounding the aorta. The largest node on the left side measures 2.2 cm on image 24/2. Inter aortocaval lymph node on image 26/2 measures 15 mm. Several smaller nodes down along the iliac chains. Bilateral pelvic adenopathy the. The largest right-sided node measures 22 mm on image 60/2 in the largest left node measures 12 mm on image 61/2. Reproductive: Markedly enlarged prostate gland measuring 8 cm. Other: No inguinal mass or inguinal adenopathy. No free pelvic fluid collections. Musculoskeletal: Diffuse sclerotic osseous metastatic disease mainly involving the bony pelvis and the lumbar spine. Pathologic fracture of L4 is noted. Small bilateral hip lesions are also noted. IMPRESSION: 1. Markedly enlarged prostate gland with metastatic pelvic and retroperitoneal lymphadenopathy. 2. Diffuse sclerotic osseous metastatic disease mainly involving the bony pelvis and the lumbar spine. Pathologic fracture of L4. 3. Small ill-defined low-attenuation hepatic lesions are indeterminate but suspicious for metastatic disease. 4. Severe bilateral hydroureteronephrosis likely due to involvement of the distal ureters by the prostate cancer. 5. Small pulmonary nodules at the lung bases certainly suspicious for pulmonary metastatic disease. 6. Age advanced atherosclerotic calcifications involving the aorta and iliac arteries. Aortic Atherosclerosis (ICD10-I70.0). Electronically Signed   By: Marijo Sanes M.D.   On: 02/13/2021 18:19   DG Hips Bilat W or Wo Pelvis 3-4 Views  Result Date: 02/13/2021 CLINICAL DATA:  Low back pain EXAM: DG HIP (WITH OR WITHOUT PELVIS) 3-4V BILAT COMPARISON:  None. FINDINGS: There is no evidence of hip fracture or dislocation. Early bilateral  hip DJD. Sclerotic foci in the left iliac wing and left sacral ala . IMPRESSION: 1. Negative for fracture or acute bone abnormality. 2. Early bilateral hip DJD. 3. Sclerotic left iliac and sacral lesions possibly benign bone islands but nonspecific. Electronically Signed   By: Lucrezia Europe M.D.   On: 02/13/2021 13:06    Labs:  CBC: Recent Labs    02/13/21 1450 02/18/21 1420  WBC 8.0 9.4  HGB 7.2* 7.3*  HCT 23.1* 22.9*  PLT 396 423    COAGS: No results for input(s): INR, APTT in the last 8760 hours.  BMP: Recent Labs    02/13/21 1450 02/18/21 1420  NA 140 139  K 3.5 4.6  CL 107 102  CO2 27 23  GLUCOSE 116* 98  BUN 40* 22  CALCIUM 9.0 9.4  CREATININE 1.56* 1.29*  GFRNONAA 51*  --     LIVER FUNCTION TESTS: Recent Labs    02/13/21 1450  BILITOT 0.6  AST 41  ALT 9  ALKPHOS 644*  PROT 7.6  ALBUMIN 3.6    TUMOR MARKERS: No results for input(s): AFPTM, CEA, CA199, CHROMGRNA in the last 8760 hours.  Assessment and Plan: 55 y.o. male with history of diabetes, hepatitis C, hypertension and advanced prostate carcinoma who presents today for Port-A-Cath placement to assist with treatment.Risks and benefits of image guided port-a-catheter placement was discussed with the patient including, but not limited to bleeding, infection, pneumothorax, or fibrin sheath development and need for additional procedures.  All of the patient's questions were answered, patient is agreeable to proceed. Consent signed and in chart.    Thank you for this interesting consult.  I greatly enjoyed meeting [facility] [place] and look forward to participating in their care.  A copy of this report was sent to the requesting provider on this date.  Electronically Signed: D. Rowe Robert, PA-C 03/04/2021, 11:38 AM   I spent a total of   25 minutes  in face to face in clinical consultation, greater than 50% of which was counseling/coordinating care for Port-A-Cath placement

## 2021-03-04 NOTE — Progress Notes (Unsigned)
Met with patient at registration to introduce myself as Arboriculturist and to offer available resources.  Discussed one-time $1000 Advertising account executive and [employer]

## 2021-03-04 NOTE — Procedures (Signed)
Interventional Radiology Procedure Note  Procedure: RT IJ POWER PORT    Complications: None  Estimated Blood Loss:  MIN  Findings: TIP SVCRA    M. TREVOR Laura Radilla, MD    

## 2021-03-05 ENCOUNTER — Ambulatory Visit: Payer: Self-pay

## 2021-03-06 ENCOUNTER — Encounter: Payer: Self-pay | Admitting: Oncology

## 2021-03-06 ENCOUNTER — Other Ambulatory Visit: Payer: Self-pay

## 2021-03-06 ENCOUNTER — Inpatient Hospital Stay: Payer: Self-pay

## 2021-03-06 ENCOUNTER — Encounter: Payer: Self-pay | Admitting: General Practice

## 2021-03-06 ENCOUNTER — Other Ambulatory Visit (HOSPITAL_COMMUNITY): Payer: Self-pay

## 2021-03-06 ENCOUNTER — Telehealth: Payer: Self-pay | Admitting: Physician Assistant

## 2021-03-06 VITALS — BP 164/105 | HR 100 | Temp 98.6°F | Resp 18

## 2021-03-06 DIAGNOSIS — C61 Malignant neoplasm of prostate: Secondary | ICD-10-CM

## 2021-03-06 MED ORDER — DEGARELIX ACETATE(240 MG DOSE) 120 MG/VIAL ~~LOC~~ SOLR
240.0000 mg | Freq: Once | SUBCUTANEOUS | Status: AC
  Administered 2021-03-06: 240 mg via SUBCUTANEOUS
  Filled 2021-03-06: qty 6

## 2021-03-06 MED ORDER — LIDOCAINE-PRILOCAINE 2.5-2.5 % EX CREA
1.0000 "application " | TOPICAL_CREAM | CUTANEOUS | 0 refills | Status: AC | PRN
  Filled 2021-03-06: qty 30, 30d supply, fill #0

## 2021-03-06 MED ORDER — PROCHLORPERAZINE MALEATE 10 MG PO TABS
10.0000 mg | ORAL_TABLET | Freq: Four times a day (QID) | ORAL | 0 refills | Status: AC | PRN
  Filled 2021-03-06: qty 30, 8d supply, fill #0

## 2021-03-06 NOTE — Progress Notes (Signed)
RN met with patient and provided calendar of appointments and provided information for [facility] and [employer].   Ambulatory referral placed for social work to review.   Pt denies any additional needs at this time.  Pt has my direct number, verbalized understanding that he would call with any additional questions or barriers that may arise.

## 2021-03-06 NOTE — Progress Notes (Signed)
..  Patient is receiving Assistance Medication - Supplied Externally. Medication: Udenyca ([id number]-cbqv) Manufacture: [employer] Approval Dates: Approved from 03/06/2021 until 03/06/2022. ID: [id number] Reason: Self Pay First DOS: 03/27/2021.  Marland KitchenJuan Quam, CPhT IV Drug Replacement Specialist [place] Phone: [phone]

## 2021-03-06 NOTE — Progress Notes (Signed)
[facility] CSW Progress Notes  Email from Altamese Cabal, [employer].  She has referred him [facility], they will contact the patient and will assist him with Medicaid and Disability.  Edwyna Shell, LCSW Clinical Social Worker Phone:  [phone]

## 2021-03-06 NOTE — Progress Notes (Signed)
Called patient to introduce myself as Arboriculturist and to offer available resources.  Discussed one-time $1000 Advertising account executive and $200 Prostate Fatima Sanger to assist with personal expenses such as medications and transportation while going through treatment. Advised what is needed to apply and he may bring at his next visit in December.  Approved patient for $200 Prostate grant which would help obtain his medications from [facility]. Message sent to RN to transfer.  Advised patient his medication should be ready to pick up after his appointment. Copy of approval letter and my card for any additional financial questions or concerns along with card for transportation program if needed, left in an white envelope with his name on it at front desk.

## 2021-03-09 ENCOUNTER — Encounter: Payer: Self-pay | Admitting: *Deleted

## 2021-03-09 NOTE — Progress Notes (Signed)
[place] Work  Clinical Social Work was referred by prostate navigator for assessment of psychosocial needs.  Clinical Social Worker contacted patient by phone  to offer support and assess for needs.  CSW explained referral to [facility] was made to assist with filing for Medicaid and Disability. CSW strongly encouraged patient to answer calls.    CSW explored other concerns or questions. Patient has no concerns at this time.      Gwinda Maine, LCSW  Clinical Social Worker [facility]

## 2021-03-10 ENCOUNTER — Other Ambulatory Visit (HOSPITAL_COMMUNITY): Payer: Self-pay

## 2021-03-10 ENCOUNTER — Encounter: Payer: Self-pay | Admitting: Oncology

## 2021-03-16 ENCOUNTER — Encounter: Payer: Self-pay | Admitting: Physician Assistant

## 2021-03-16 ENCOUNTER — Encounter (HOSPITAL_COMMUNITY)
Admission: RE | Admit: 2021-03-16 | Discharge: 2021-03-16 | Disposition: A | Discharge status: Home / Self Care | Payer: Medicaid Other | Source: Ambulatory Visit | Attending: Oncology | Admitting: Oncology

## 2021-03-16 ENCOUNTER — Other Ambulatory Visit: Payer: Self-pay

## 2021-03-16 DIAGNOSIS — N131 Hydronephrosis with ureteral stricture, not elsewhere classified: Secondary | ICD-10-CM | POA: Insufficient documentation

## 2021-03-16 DIAGNOSIS — C61 Malignant neoplasm of prostate: Secondary | ICD-10-CM | POA: Insufficient documentation

## 2021-03-16 DIAGNOSIS — C7951 Secondary malignant neoplasm of bone: Secondary | ICD-10-CM | POA: Insufficient documentation

## 2021-03-16 DIAGNOSIS — C7982 Secondary malignant neoplasm of genital organs: Secondary | ICD-10-CM | POA: Insufficient documentation

## 2021-03-16 DIAGNOSIS — M899 Disorder of bone, unspecified: Secondary | ICD-10-CM | POA: Diagnosis not present

## 2021-03-16 DIAGNOSIS — R590 Localized enlarged lymph nodes: Secondary | ICD-10-CM | POA: Insufficient documentation

## 2021-03-16 MED ORDER — PIFLIFOLASTAT F 18 (PYLARIFY) INJECTION
9.0000 | Freq: Once | INTRAVENOUS | Status: AC
  Administered 2021-03-16: 14:00:00 8.94 via INTRAVENOUS

## 2021-03-16 NOTE — Progress Notes (Signed)
Met with patient at registration to complete [employer] paperwork.  Discussed in detail expenses and how they are covered. He has a copy of the approval letter and expense sheet along with the Outpatient pharmacy information.   He has paperwork and my card in green folder for any additional financial questions or concerns.

## 2021-03-18 NOTE — Progress Notes (Signed)
Pharmacist Chemotherapy Monitoring - Initial Assessment    Anticipated start date: 03/25/21   The following has been reviewed per standard work regarding the patient's treatment regimen: The patient's diagnosis, treatment plan and drug doses, and organ/hematologic function Lab orders and baseline tests specific to treatment regimen  The treatment plan start date, drug sequencing, and pre-medications Prior authorization status  Patient's documented medication list, including drug-drug interaction screen and prescriptions for anti-emetics and supportive care specific to the treatment regimen The drug concentrations, fluid compatibility, administration routes, and timing of the medications to be used The patient's access for treatment and lifetime cumulative dose history, if applicable  The patient's medication allergies and previous infusion related reactions, if applicable   Changes made to treatment plan:  N/A  Follow up needed:  N/A   Wynona Neat, [facility], 03/18/2021  2:46 PM

## 2021-03-24 MED FILL — Dexamethasone Sodium Phosphate Inj 100 MG/10ML: INTRAMUSCULAR | Qty: 1 | Status: AC

## 2021-03-25 ENCOUNTER — Inpatient Hospital Stay: Payer: Self-pay

## 2021-03-25 ENCOUNTER — Inpatient Hospital Stay: Payer: Self-pay | Attending: Oncology

## 2021-03-25 ENCOUNTER — Other Ambulatory Visit: Payer: Self-pay

## 2021-03-25 VITALS — BP 148/87 | HR 88 | Temp 98.3°F | Resp 16 | Wt 158.1 lb

## 2021-03-25 DIAGNOSIS — C61 Malignant neoplasm of prostate: Secondary | ICD-10-CM

## 2021-03-25 DIAGNOSIS — D630 Anemia in neoplastic disease: Secondary | ICD-10-CM | POA: Insufficient documentation

## 2021-03-25 DIAGNOSIS — Z79899 Other long term (current) drug therapy: Secondary | ICD-10-CM | POA: Insufficient documentation

## 2021-03-25 DIAGNOSIS — Z191 Hormone sensitive malignancy status: Secondary | ICD-10-CM | POA: Insufficient documentation

## 2021-03-25 DIAGNOSIS — C7951 Secondary malignant neoplasm of bone: Secondary | ICD-10-CM | POA: Insufficient documentation

## 2021-03-25 DIAGNOSIS — R599 Enlarged lymph nodes, unspecified: Secondary | ICD-10-CM | POA: Insufficient documentation

## 2021-03-25 DIAGNOSIS — Z95828 Presence of other vascular implants and grafts: Secondary | ICD-10-CM

## 2021-03-25 DIAGNOSIS — C787 Secondary malignant neoplasm of liver and intrahepatic bile duct: Secondary | ICD-10-CM | POA: Insufficient documentation

## 2021-03-25 DIAGNOSIS — N1330 Unspecified hydronephrosis: Secondary | ICD-10-CM | POA: Insufficient documentation

## 2021-03-25 DIAGNOSIS — Z5111 Encounter for antineoplastic chemotherapy: Secondary | ICD-10-CM | POA: Insufficient documentation

## 2021-03-25 LAB — CMP (CANCER CENTER ONLY)
ALT: 13 U/L (ref 0–44)
AST: 18 U/L (ref 15–41)
Albumin: 3.6 g/dL (ref 3.5–5.0)
Alkaline Phosphatase: 1424 U/L — ABNORMAL HIGH (ref 38–126)
Anion gap: 9 (ref 5–15)
BUN: 28 mg/dL — ABNORMAL HIGH (ref 6–20)
CO2: 23 mmol/L (ref 22–32)
Calcium: 8.8 mg/dL — ABNORMAL LOW (ref 8.9–10.3)
Chloride: 107 mmol/L (ref 98–111)
Creatinine: 1.33 mg/dL — ABNORMAL HIGH (ref 0.61–1.24)
GFR, Estimated: >60 mL/min (ref >60)
Glucose, Bld: 135 mg/dL — ABNORMAL HIGH (ref 70–99)
Potassium: 4.1 mmol/L (ref 3.5–5.1)
Sodium: 139 mmol/L (ref 135–145)
Total Bilirubin: 0.3 mg/dL (ref 0.3–1.2)
Total Protein: 7 g/dL (ref 6.5–8.1)

## 2021-03-25 LAB — CBC WITH DIFFERENTIAL (CANCER CENTER ONLY)
Abs Immature Granulocytes: 0.27 10*3/uL — ABNORMAL HIGH (ref 0.00–0.07)
Basophils Absolute: 0 10*3/uL (ref 0.0–0.1)
Basophils Relative: 1 %
Eosinophils Absolute: 0.2 10*3/uL (ref 0.0–0.5)
Eosinophils Relative: 3 %
HCT: 23.2 % — ABNORMAL LOW (ref 39.0–52.0)
Hemoglobin: 7.1 g/dL — ABNORMAL LOW (ref 13.0–17.0)
Immature Granulocytes: 5 %
Lymphocytes Relative: 28 %
Lymphs Abs: 1.5 10*3/uL (ref 0.7–4.0)
MCH: 25.3 pg — ABNORMAL LOW (ref 26.0–34.0)
MCHC: 30.6 g/dL (ref 30.0–36.0)
MCV: 82.6 fL (ref 80.0–100.0)
Monocytes Absolute: 0.6 10*3/uL (ref 0.1–1.0)
Monocytes Relative: 11 %
Neutro Abs: 2.8 10*3/uL (ref 1.7–7.7)
Neutrophils Relative %: 52 %
Platelet Count: 295 10*3/uL (ref 150–400)
RBC: 2.81 MIL/uL — ABNORMAL LOW (ref 4.22–5.81)
RDW: 17.4 % — ABNORMAL HIGH (ref 11.5–15.5)
WBC Count: 5.4 10*3/uL (ref 4.0–10.5)
nRBC: 0.4 % — ABNORMAL HIGH (ref 0.0–0.2)

## 2021-03-25 MED ORDER — SODIUM CHLORIDE 0.9% FLUSH
10.0000 mL | INTRAVENOUS | Status: DC | PRN
  Administered 2021-03-25: 10 mL via INTRAVENOUS

## 2021-03-25 MED ORDER — SODIUM CHLORIDE 0.9 % IV SOLN
75.0000 mg/m2 | Freq: Once | INTRAVENOUS | Status: AC
  Administered 2021-03-25: 150 mg via INTRAVENOUS
  Filled 2021-03-25: qty 15

## 2021-03-25 MED ORDER — SODIUM CHLORIDE 0.9 % IV SOLN
10.0000 mg | Freq: Once | INTRAVENOUS | Status: AC
  Administered 2021-03-25: 10 mg via INTRAVENOUS
  Filled 2021-03-25: qty 10

## 2021-03-25 MED ORDER — HEPARIN SOD (PORK) LOCK FLUSH 100 UNIT/ML IV SOLN
500.0000 [IU] | Freq: Once | INTRAVENOUS | Status: AC | PRN
  Administered 2021-03-25: 500 [IU]

## 2021-03-25 MED ORDER — SODIUM CHLORIDE 0.9 % IV SOLN
Freq: Once | INTRAVENOUS | Status: AC
Start: 2021-03-25 — End: 2021-03-25

## 2021-03-25 MED ORDER — SODIUM CHLORIDE 0.9% FLUSH
10.0000 mL | INTRAVENOUS | Status: DC | PRN
  Administered 2021-03-25: 10 mL

## 2021-03-25 NOTE — Patient Instructions (Signed)
[facility]COL[facility]  Discharge Instructions: Thank you for choosing [facility] to provide your oncology and hematology care.   If you have a lab appointment with the [facility], please go directly to the [facility] and check in at the registration area.   Wear comfortable clothing and clothing appropriate for easy access to any Portacath or PICC line.   We strive to give you quality time with your provider. You may need to reschedule your appointment if you arrive late (15 or more minutes).  Arriving late affects you and other patients whose appointments are after yours.  Also, if you miss three or more appointments without notifying the office, you may be dismissed from the clinic at the provider's discretion.      For prescription refill requests, have your pharmacy contact our office and allow 72 hours for refills to be completed.    Today you received the following chemotherapy and/or immunotherapy agents: Taxotere      To help prevent nausea and vomiting after your treatment, we encourage you to take your nausea medication as directed.  BELOW ARE SYMPTOMS THAT SHOULD BE REPORTED IMMEDIATELY: *FEVER GREATER THAN 100.4 F (38 C) OR HIGHER *CHILLS OR SWEATING *NAUSEA AND VOMITING THAT IS NOT CONTROLLED WITH YOUR NAUSEA MEDICATION *UNUSUAL SHORTNESS OF BREATH *UNUSUAL BRUISING OR BLEEDING *URINARY PROBLEMS (pain or burning when urinating, or frequent urination) *BOWEL PROBLEMS (unusual diarrhea, constipation, pain near the anus) TENDERNESS IN MOUTH AND THROAT WITH OR WITHOUT PRESENCE OF ULCERS (sore throat, sores in mouth, or a toothache) UNUSUAL RASH, SWELLING OR PAIN  UNUSUAL VAGINAL DISCHARGE OR ITCHING   Items with * indicate a potential emergency and should be followed up as soon as possible or go to the Emergency Department if any problems should occur.  Please show the CHEMOTHERAPY ALERT CARD or IMMUNOTHERAPY ALERT CARD at check-in to  the Emergency Department and triage nurse.  Should you have questions after your visit or need to cancel or reschedule your appointment, please contact [facility]  Dept: [phone]  and follow the prompts.  Office hours are 8:00 a.m. to 4:30 p.m. Monday - Friday. Please note that voicemails left after 4:00 p.m. may not be returned until the following business day.  We are closed weekends and major holidays. You have access to a nurse at all times for urgent questions. Please call the main number to the clinic Dept: [phone] and follow the prompts.   For any non-urgent questions, you may also contact your provider using MyChart. We now offer e-Visits for anyone 27 and older to request care online for non-urgent symptoms. For details visit [email or web address].   Also download the MyChart app! Go to the app store, search "MyChart", open the app, select [facility], and log in with your MyChart username and password.  Due to Covid, a mask is required upon entering the hospital/clinic. If you do not have a mask, one will be given to you upon arrival. For doctor visits, patients may have 1 support person aged 31 or older with them. For treatment visits, patients cannot have anyone with them due to current Covid guidelines and our immunocompromised population.   Docetaxel injection What is this medication? DOCETAXEL (doe se TAX el) is a chemotherapy drug. It targets fast dividing cells, like cancer cells, and causes these cells to die. This medicine is used to treat many types of cancers like breast cancer, certain stomach cancers, head and neck cancer,  lung cancer, and prostate cancer. This medicine may be used for other purposes; ask your health care provider or pharmacist if you have questions. COMMON BRAND NAME(S): Docefrez, Taxotere What should I tell my care team before I take this medication? They need to know if you have any of these conditions: infection  (especially a virus infection such as chickenpox, cold sores, or herpes) liver disease low blood counts, like low white cell, platelet, or red cell counts an unusual or allergic reaction to docetaxel, polysorbate 80, other chemotherapy agents, other medicines, foods, dyes, or preservatives pregnant or trying to get pregnant breast-feeding How should I use this medication? This drug is given as an infusion into a vein. It is administered in a hospital or clinic by a specially trained health care professional. Talk to your pediatrician regarding the use of this medicine in children. Special care may be needed. Overdosage: If you think you have taken too much of this medicine contact a poison control center or emergency room at once. NOTE: This medicine is only for you. Do not share this medicine with others. What if I miss a dose? It is important not to miss your dose. Call your doctor or health care professional if you are unable to keep an appointment. What may interact with this medication? Do not take this medicine with any of the following medications: live virus vaccines This medicine may also interact with the following medications: aprepitant certain antibiotics like erythromycin or clarithromycin certain antivirals for HIV or hepatitis certain medicines for fungal infections like fluconazole, itraconazole, ketoconazole, posaconazole, or voriconazole cimetidine ciprofloxacin conivaptan cyclosporine dronedarone fluvoxamine grapefruit juice imatinib verapamil This list may not describe all possible interactions. Give your health care provider a list of all the medicines, herbs, non-prescription drugs, or dietary supplements you use. Also tell them if you smoke, drink alcohol, or use illegal drugs. Some items may interact with your medicine. What should I watch for while using this medication? Your condition will be monitored carefully while you are receiving this medicine. You  will need important blood work done while you are taking this medicine. Call your doctor or health care professional for advice if you get a fever, chills or sore throat, or other symptoms of a cold or flu. Do not treat yourself. This drug decreases your body's ability to fight infections. Try to avoid being around people who are sick. Some products may contain alcohol. Ask your health care professional if this medicine contains alcohol. Be sure to tell all health care professionals you are taking this medicine. Certain medicines, like metronidazole and disulfiram, can cause an unpleasant reaction when taken with alcohol. The reaction includes flushing, headache, nausea, vomiting, sweating, and increased thirst. The reaction can last from 30 minutes to several hours. You may get drowsy or dizzy. Do not drive, use machinery, or do anything that needs mental alertness until you know how this medicine affects you. Do not stand or sit up quickly, especially if you are an older patient. This reduces the risk of dizzy or fainting spells. Alcohol may interfere with the effect of this medicine. Talk to your health care professional about your risk of cancer. You may be more at risk for certain types of cancer if you take this medicine. Do not become pregnant while taking this medicine or for 6 months after stopping it. Women should inform their doctor if they wish to become pregnant or think they might be pregnant. There is a potential for serious side effects to  an unborn child. Talk to your health care professional or pharmacist for more information. Do not breast-feed an infant while taking this medicine or for 1 week after stopping it. Males who get this medicine must use a condom during sex with females who can get pregnant. If you get a woman pregnant, the baby could have birth defects. The baby could die before they are born. You will need to continue wearing a condom for 3 months after stopping the medicine.  Tell your health care provider right away if your partner becomes pregnant while you are taking this medicine. This may interfere with the ability to father a child. You should talk to your doctor or health care professional if you are concerned about your fertility. What side effects may I notice from receiving this medication? Side effects that you should report to your doctor or health care professional as soon as possible: allergic reactions like skin rash, itching or hives, swelling of the face, lips, or tongue blurred vision breathing problems changes in vision low blood counts - This drug may decrease the number of white blood cells, red blood cells and platelets. You may be at increased risk for infections and bleeding. nausea and vomiting pain, redness or irritation at site where injected pain, tingling, numbness in the hands or feet redness, blistering, peeling, or loosening of the skin, including inside the mouth signs of decreased platelets or bleeding - bruising, pinpoint red spots on the skin, black, tarry stools, nosebleeds signs of decreased red blood cells - unusually weak or tired, fainting spells, lightheadedness signs of infection - fever or chills, cough, sore throat, pain or difficulty passing urine swelling of the ankle, feet, hands Side effects that usually do not require medical attention (report to your doctor or health care professional if they continue or are bothersome): constipation diarrhea fingernail or toenail changes hair loss loss of appetite mouth sores muscle pain This list may not describe all possible side effects. Call your doctor for medical advice about side effects. You may report side effects to FDA at 1-800-FDA-1088. Where should I keep my medication? This drug is given in a hospital or clinic and will not be stored at home. NOTE: This sheet is a summary. It may not cover all possible information. If you have questions about this medicine, talk  to your doctor, pharmacist, or health care provider.  2022 Elsevier/Gold Standard (2020-12-23 00:00:00)

## 2021-03-25 NOTE — Progress Notes (Signed)
Per Dr. Alen Blew, ok to proceed with hgb 7.1. Patient denies symptoms of anemia.

## 2021-03-26 LAB — PROSTATE-SPECIFIC AG, SERUM (LABCORP): Prostate Specific Ag, Serum: 69.9 ng/mL — ABNORMAL HIGH (ref 0.0–4.0)

## 2021-03-27 ENCOUNTER — Other Ambulatory Visit: Payer: Self-pay

## 2021-03-27 ENCOUNTER — Inpatient Hospital Stay: Payer: Self-pay

## 2021-03-27 VITALS — BP 129/82 | HR 98 | Temp 98.0°F | Resp 16

## 2021-03-27 DIAGNOSIS — C61 Malignant neoplasm of prostate: Secondary | ICD-10-CM

## 2021-03-27 MED ORDER — PEGFILGRASTIM-CBQV 6 MG/0.6ML ~~LOC~~ SOSY
6.0000 mg | PREFILLED_SYRINGE | Freq: Once | SUBCUTANEOUS | Status: AC
  Administered 2021-03-27: 6 mg via SUBCUTANEOUS
  Filled 2021-03-27: qty 0.6

## 2021-03-27 NOTE — Patient Instructions (Signed)

## 2021-04-02 ENCOUNTER — Encounter: Payer: Self-pay | Admitting: Oncology

## 2021-04-02 NOTE — Progress Notes (Signed)
GFE(Good Faith Estimate) created to provide to patient.  Placing in outgoing mail along with information for patient to receive by next treatment.  He has my card for any additional financial questions or concerns.

## 2021-04-15 ENCOUNTER — Inpatient Hospital Stay: Payer: Self-pay

## 2021-04-15 ENCOUNTER — Inpatient Hospital Stay (HOSPITAL_BASED_OUTPATIENT_CLINIC_OR_DEPARTMENT_OTHER): Payer: Self-pay | Admitting: Oncology

## 2021-04-15 ENCOUNTER — Other Ambulatory Visit: Payer: Self-pay

## 2021-04-15 VITALS — BP 176/96 | HR 90 | Temp 97.8°F | Resp 17 | Ht 75.0 in | Wt 170.0 lb

## 2021-04-15 DIAGNOSIS — Z95828 Presence of other vascular implants and grafts: Secondary | ICD-10-CM

## 2021-04-15 DIAGNOSIS — C61 Malignant neoplasm of prostate: Secondary | ICD-10-CM

## 2021-04-15 LAB — CMP (CANCER CENTER ONLY)
ALT: 16 U/L (ref 0–44)
AST: 19 U/L (ref 15–41)
Albumin: 3.9 g/dL (ref 3.5–5.0)
Alkaline Phosphatase: 836 U/L — ABNORMAL HIGH (ref 38–126)
Anion gap: 7 (ref 5–15)
BUN: 19 mg/dL (ref 6–20)
CO2: 25 mmol/L (ref 22–32)
Calcium: 9.2 mg/dL (ref 8.9–10.3)
Chloride: 107 mmol/L (ref 98–111)
Creatinine: 1.09 mg/dL (ref 0.61–1.24)
GFR, Estimated: >60 mL/min (ref >60)
Glucose, Bld: 177 mg/dL — ABNORMAL HIGH (ref 70–99)
Potassium: 3.6 mmol/L (ref 3.5–5.1)
Sodium: 139 mmol/L (ref 135–145)
Total Bilirubin: 0.3 mg/dL (ref 0.3–1.2)
Total Protein: 6.9 g/dL (ref 6.5–8.1)

## 2021-04-15 LAB — CBC WITH DIFFERENTIAL (CANCER CENTER ONLY)
Abs Immature Granulocytes: 0.17 10*3/uL — ABNORMAL HIGH (ref 0.00–0.07)
Basophils Absolute: 0.1 10*3/uL (ref 0.0–0.1)
Basophils Relative: 1 %
Eosinophils Absolute: 0.2 10*3/uL (ref 0.0–0.5)
Eosinophils Relative: 3 %
HCT: 24 % — ABNORMAL LOW (ref 39.0–52.0)
Hemoglobin: 7.4 g/dL — ABNORMAL LOW (ref 13.0–17.0)
Immature Granulocytes: 2 %
Lymphocytes Relative: 15 %
Lymphs Abs: 1 10*3/uL (ref 0.7–4.0)
MCH: 25.9 pg — ABNORMAL LOW (ref 26.0–34.0)
MCHC: 30.8 g/dL (ref 30.0–36.0)
MCV: 83.9 fL (ref 80.0–100.0)
Monocytes Absolute: 0.7 10*3/uL (ref 0.1–1.0)
Monocytes Relative: 9 %
Neutro Abs: 4.9 10*3/uL (ref 1.7–7.7)
Neutrophils Relative %: 70 %
Platelet Count: 359 10*3/uL (ref 150–400)
RBC: 2.86 MIL/uL — ABNORMAL LOW (ref 4.22–5.81)
RDW: 19 % — ABNORMAL HIGH (ref 11.5–15.5)
WBC Count: 7.1 10*3/uL (ref 4.0–10.5)
nRBC: 0.3 % — ABNORMAL HIGH (ref 0.0–0.2)

## 2021-04-15 MED ORDER — HEPARIN SOD (PORK) LOCK FLUSH 100 UNIT/ML IV SOLN
500.0000 [IU] | Freq: Once | INTRAVENOUS | Status: AC | PRN
  Administered 2021-04-15: 12:00:00 500 [IU]

## 2021-04-15 MED ORDER — SODIUM CHLORIDE 0.9 % IV SOLN
75.0000 mg/m2 | Freq: Once | INTRAVENOUS | Status: AC
  Administered 2021-04-15: 11:00:00 150 mg via INTRAVENOUS
  Filled 2021-04-15: qty 15

## 2021-04-15 MED ORDER — DEXAMETHASONE SODIUM PHOSPHATE 100 MG/10ML IJ SOLN
10.0000 mg | Freq: Once | INTRAMUSCULAR | Status: AC
  Administered 2021-04-15: 10:00:00 10 mg via INTRAVENOUS
  Filled 2021-04-15: qty 10

## 2021-04-15 MED ORDER — SODIUM CHLORIDE 0.9% FLUSH
10.0000 mL | INTRAVENOUS | Status: AC | PRN
  Administered 2021-04-15: 08:00:00 10 mL

## 2021-04-15 MED ORDER — SODIUM CHLORIDE 0.9 % IV SOLN: Freq: Once | INTRAVENOUS | Status: AC

## 2021-04-15 MED ORDER — DEGARELIX ACETATE 80 MG ~~LOC~~ SOLR
80.0000 mg | Freq: Once | SUBCUTANEOUS | Status: AC
  Administered 2021-04-15: 10:00:00 80 mg via SUBCUTANEOUS
  Filled 2021-04-15: qty 4

## 2021-04-15 MED ORDER — SODIUM CHLORIDE 0.9% FLUSH
10.0000 mL | INTRAVENOUS | Status: DC | PRN
  Administered 2021-04-15: 12:00:00 10 mL

## 2021-04-15 NOTE — Progress Notes (Signed)
Ok to treat with hgb 7.4 per Dr Alen Blew. Patient reports feeling well with no complaints

## 2021-04-15 NOTE — Progress Notes (Signed)
Hematology and Oncology Follow Up Visit  Shane Jackson [id number] 08-22-1965 55 y.o. 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   Prior Therapy:  He is status post Port-A-Cath insertion on 03/04/2021.  Current therapy:   Firmagon 240 mg started on March 06, 2021  Taxotere chemotherapy 75 mg per metered square started on March 25, 2021.  He is here for cycle 2 of therapy.  Interim History: Shane Jackson returns today for a follow-up visit.  Since the last visit, he reports no major changes in his health.  He tolerated Mills Koller and Taxotere chemotherapy without any major complaints.  He denies any nausea, vomiting or abdominal pain.  He denies any bone pain or pathological fractures.  He is feeling much better after the start of therapy including eating better with improved performance status.      Medications: I have reviewed the patient's current medications.  Current Outpatient Medications  Medication Sig Dispense Refill   ferrous sulfate 325 (65 FE) MG tablet Take 1 tablet (325 mg total) by mouth daily. Start by taking one tablet every other day. Over the next 2 weeks increase your dose to once daily. (Patient not taking: Reported on 02/20/2021) 30 tablet 0   lidocaine-prilocaine (EMLA) cream Apply 1 application topically as needed. 30 g 0   prochlorperazine (COMPAZINE) 10 MG tablet Take 1 tablet (10 mg total) by mouth every 6 (six) hours as needed for nausea or vomiting. 30 tablet 0   No current facility-administered medications for this visit.     Allergies: No Known Allergies  .  Physical Exam: Blood pressure (!) 176/96, pulse 90, temperature 97.8 F (36.6 C), temperature source Tympanic, resp. rate 17, height 6\' 3"  (1.905 m), weight 170 lb (77.1 kg), SpO2 100  %.  ECOG: 1   General appearance: Comfortable appearing without any discomfort Head: Normocephalic without any trauma Oropharynx: Mucous membranes are moist and pink without any thrush or ulcers. Eyes: Pupils are equal and round reactive to light. Lymph nodes: No cervical, supraclavicular, inguinal or axillary lymphadenopathy.   Heart:regular rate and rhythm.  S1 and S2 without leg edema. Lung: Clear without any rhonchi or wheezes.  No dullness to percussion. Abdomin: Soft, nontender, nondistended with good bowel sounds.  No hepatosplenomegaly. Musculoskeletal: No joint deformity or effusion.  Full range of motion noted. Neurological: No deficits noted on motor, sensory and deep tendon reflex exam. Skin: No petechial rash or dryness.  Appeared moist.      Lab Results: Lab Results  Component Value Date   WBC 5.4 03/25/2021   HGB 7.1 (L) 03/25/2021   HCT 23.2 (L) 03/25/2021   MCV 82.6 03/25/2021   PLT 295 03/25/2021     Chemistry      Component Value Date/Time   NA 139 03/25/2021 1207   NA 139 02/18/2021 1420   K 4.1 03/25/2021 1207   CL 107 03/25/2021 1207   CO2 23 03/25/2021 1207   BUN 28 (H) 03/25/2021 1207   BUN 22 02/18/2021 1420   CREATININE 1.33 (H) 03/25/2021 1207      Component Value Date/Time   CALCIUM 8.8 (L) 03/25/2021 1207   ALKPHOS 1,424 (H) 03/25/2021 1207   AST 18 03/25/2021 1207   ALT 13 03/25/2021 1207   BILITOT 0.3 03/25/2021 1207      IMPRESSION: 1.  Intense metabolic activity within the prostate gland consistent with primary prostate carcinoma. 2. Evidence of extracapsular spread of carcinoma within LEFT and RIGHT pelvis. 3. Obstruction of the RIGHT ureter by extracapsular prostate carcinoma extension. Severe RIGHT hydronephrosis and hydroureter. 4. Radiotracer avid metastatic adenopathy within the pelvis. 5. Multifocal intensely radiotracer avid skeletal metastasis within the axillary appendicular skeleton. Approximately 40 lesions. 6. No  evidence of liver metastasis or pulmonary metastasis.     Latest Reference Range & Units 03/25/21 12:07  Prostate Specific Ag, Serum 0.0 - 4.0 ng/mL 69.9 (H)  (H): Data is abnormally high  Impression and Plan:  55 year old with:  1.  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  He continues to tolerate chemotherapy without any major complications.  His PSA has dropped significantly after hormonal therapy in the first cycle of chemotherapy.  Risks and benefits of continuing this treatment long-term were reviewed.  Plan is to complete 6 cycles.  Adding androgen receptor pathway inhibitor could be considered for triple therapy in the near future.   2.  Androgen deprivation: He will receive Mills Koller this week and switch to Eligard after that.  Complication occluding weight gain, hot flashes among others were reiterated.   3.  IV access: Port-A-Cath inserted without any issues.   4.  Antiemetics: Compazine is available to him without any nausea or vomiting.   5.  Bone directed therapy: The will remain on calcium and vitamin D supplements.  Delton See will be deferred until he obtains dental clearance.   6.  Prognosis and goals of care: His disease is palliative although aggressive measures are warranted.   7.  Anemia: Related to malignancy and chronic disease.  Iron studies did not show pattern of iron deficiency.   8.  Hydronephrosis: His kidney function remained stable and he is asymptomatic.  We will arrange urology follow-up.   9.  Follow-up: 3 weeks for the next cycle of therapy.   30  minutes were spent on this encounter.  The time was dedicated to reviewing his disease status, addressing complication related to cancer and cancer therapy and future plan of care review.    Zola Button, MD 12/28/20228:15 AM

## 2021-04-15 NOTE — Patient Instructions (Signed)
[facility]  Discharge Instructions: Thank you for choosing [facility] to provide your oncology and hematology care.   If you have a lab appointment with the [facility], please go directly to the [facility] and check in at the registration area.   Wear comfortable clothing and clothing appropriate for easy access to any Portacath or PICC line.   We strive to give you quality time with your provider. You may need to reschedule your appointment if you arrive late (15 or more minutes).  Arriving late affects you and other patients whose appointments are after yours.  Also, if you miss three or more appointments without notifying the office, you may be dismissed from the clinic at the providers discretion.      For prescription refill requests, have your pharmacy contact our office and allow 72 hours for refills to be completed.    Today you received the following chemotherapy and/or immunotherapy agents: Docetaxel      To help prevent nausea and vomiting after your treatment, we encourage you to take your nausea medication as directed.  BELOW ARE SYMPTOMS THAT SHOULD BE REPORTED IMMEDIATELY: *FEVER GREATER THAN 100.4 F (38 C) OR HIGHER *CHILLS OR SWEATING *NAUSEA AND VOMITING THAT IS NOT CONTROLLED WITH YOUR NAUSEA MEDICATION *UNUSUAL SHORTNESS OF BREATH *UNUSUAL BRUISING OR BLEEDING *URINARY PROBLEMS (pain or burning when urinating, or frequent urination) *BOWEL PROBLEMS (unusual diarrhea, constipation, pain near the anus) TENDERNESS IN MOUTH AND THROAT WITH OR WITHOUT PRESENCE OF ULCERS (sore throat, sores in mouth, or a toothache) UNUSUAL RASH, SWELLING OR PAIN  UNUSUAL VAGINAL DISCHARGE OR ITCHING   Items with * indicate a potential emergency and should be followed up as soon as possible or go to the Emergency Department if any problems should occur.  Please show the CHEMOTHERAPY ALERT CARD or IMMUNOTHERAPY ALERT CARD at check-in to  the Emergency Department and triage nurse.  Should you have questions after your visit or need to cancel or reschedule your appointment, please contact [facility]  Dept: [phone]  and follow the prompts.  Office hours are 8:00 a.m. to 4:30 p.m. Monday - Friday. Please note that voicemails left after 4:00 p.m. may not be returned until the following business day.  We are closed weekends and major holidays. You have access to a nurse at all times for urgent questions. Please call the main number to the clinic Dept: [phone] and follow the prompts.   For any non-urgent questions, you may also contact your provider using MyChart. We now offer e-Visits for anyone 9 and older to request care online for non-urgent symptoms. For details visit [email or web address].   Also download the MyChart app! Go to the app store, search "MyChart", open the app, select [facility], and log in with your MyChart username and password.  Due to Covid, a mask is required upon entering the hospital/clinic. If you do not have a mask, one will be given to you upon arrival. For doctor visits, patients may have 1 support person aged 6 or older with them. For treatment visits, patients cannot have anyone with them due to current Covid guidelines and our immunocompromised population.   Degarelix injection What is this medication? DEGARELIX (deg a REL ix) is used to treat men with advanced prostate cancer. This medicine may be used for other purposes; ask your health care provider or pharmacist if you have questions. COMMON BRAND NAME(S): Degarelix, Mills Koller What should I tell my  care team before I take this medication? They need to know if you have any of these conditions: diabetes heart disease kidney disease liver disease low levels of potassium or magnesium in the blood osteoporosis an unusual or allergic reaction to degarelix, mannitol, other medicines, foods, dyes, or  preservatives pregnant or trying to get pregnant breast-feeding How should I use this medication? This medicine is for injection under the skin. It is usually given by a health care professional in a hospital or clinic setting. If you get this medicine at home, you will be taught how to prepare and give this medicine. Use exactly as directed. Take your medicine at regular intervals. Do not take it more often than directed. It is important that you put your used needles and syringes in a special sharps container. Do not put them in a trash can. If you do not have a sharps container, call your pharmacist or healthcare provider to get one. Talk to your pediatrician regarding the use of this medicine in children. Special care may be needed. Overdosage: If you think you have taken too much of this medicine contact a poison control center or emergency room at once. NOTE: This medicine is only for you. Do not share this medicine with others. What if I miss a dose? Try not to miss a dose. If you do miss a dose, call your doctor or health care professional for advice. What may interact with this medication? Do not take this medicine with any of the following medications: cisapride dronedarone pimozide thioridazine This medicine may also interact with the following medications: other medicines that prolong the QT interval (abnormal heart rhythm) This list may not describe all possible interactions. Give your health care provider a list of all the medicines, herbs, non-prescription drugs, or dietary supplements you use. Also tell them if you smoke, drink alcohol, or use illegal drugs. Some items may interact with your medicine. What should I watch for while using this medication? Visit your doctor or health care professional for regular checks on your progress and discuss any issues before you start taking this medicine. Do not rub or scratch injection site. There may be a lump at the injection site, or  it may be red or sore for a few days after your dose. Your doctor or health care professional will need to monitor your hormone levels in your blood to check your response to treatment. Try to keep any appointments for testing. What side effects may I notice from receiving this medication? Side effects that you should report to your doctor or health care professional as soon as possible: allergic reactions like skin rash, itching or hives, swelling of the face, lips, or tongue fever or chills irregular heartbeat nausea and vomiting along with severe abdominal pain pain or difficulty passing urine pelvic pain or bloating signs and symptoms of high blood sugar such as being more thirsty or hungry or having to urinate more than normal. You may also feel very tired or have blurry vision Side effects that usually do not require medical attention (report to your doctor or health care professional if they continue or are bothersome): change in sex drive or performance constipation headache high blood pressure hot flashes (flushing of skin, increased sweating) itching, redness or mild pain at site where injected joint pain trouble sleeping unusually weak or tired weight gain This list may not describe all possible side effects. Call your doctor for medical advice about side effects. You may report side effects to  FDA at 1-800-FDA-1088. Where should I keep my medication? Keep out of the reach of children. This drug is usually given in a hospital or clinic and will not be stored at home. In rare cases, this medicine may be given at home. If you are using this medicine at home, you will be instructed on how to store this medicine. Throw away any unused medicine after the expiration date on the label. NOTE: This sheet is a summary. It may not cover all possible information. If you have questions about this medicine, talk to your doctor, pharmacist, or health care provider.  2022 Elsevier/Gold  Standard (2020-12-23 00:00:00)

## 2021-04-16 LAB — PROSTATE-SPECIFIC AG, SERUM (LABCORP): Prostate Specific Ag, Serum: 13.8 ng/mL — ABNORMAL HIGH (ref 0.0–4.0)

## 2021-04-17 ENCOUNTER — Ambulatory Visit: Payer: Self-pay

## 2021-04-17 ENCOUNTER — Inpatient Hospital Stay: Payer: Self-pay

## 2021-04-17 ENCOUNTER — Other Ambulatory Visit: Payer: Self-pay

## 2021-04-17 VITALS — BP 132/84 | HR 108 | Temp 98.2°F | Resp 18

## 2021-04-17 DIAGNOSIS — C61 Malignant neoplasm of prostate: Secondary | ICD-10-CM

## 2021-04-17 MED ORDER — PEGFILGRASTIM-CBQV 6 MG/0.6ML ~~LOC~~ SOSY
6.0000 mg | PREFILLED_SYRINGE | Freq: Once | SUBCUTANEOUS | Status: AC
  Administered 2021-04-17: 6 mg via SUBCUTANEOUS
  Filled 2021-04-17: qty 0.6

## 2021-04-17 NOTE — Patient Instructions (Signed)

## 2021-04-22 NOTE — Progress Notes (Signed)
..  Patient is receiving Assistance Medication - Supplied Externally. Medication: Udenyca (pegfilgrastim-cbqv) Manufacture: [employer] Approval Dates: Approved from 04/22/2021 until 04/22/2022. ID: [id number] Reason: Self Pay First DOS: 05/08/2021.  *Re-enrollment for 2023*  .Marland KitchenJuan Quam, CPhT IV Drug Replacement Specialist [facility] Phone: [phone]

## 2021-05-04 ENCOUNTER — Other Ambulatory Visit: Payer: Self-pay

## 2021-05-04 ENCOUNTER — Inpatient Hospital Stay: Payer: Self-pay

## 2021-05-04 ENCOUNTER — Inpatient Hospital Stay: Payer: Self-pay | Attending: Oncology | Admitting: Oncology

## 2021-05-04 VITALS — BP 170/99 | HR 85 | Resp 18

## 2021-05-04 VITALS — BP 162/90 | HR 92 | Temp 97.9°F | Resp 18 | Ht 75.0 in | Wt 175.5 lb

## 2021-05-04 DIAGNOSIS — Z5111 Encounter for antineoplastic chemotherapy: Secondary | ICD-10-CM | POA: Insufficient documentation

## 2021-05-04 DIAGNOSIS — Z95828 Presence of other vascular implants and grafts: Secondary | ICD-10-CM

## 2021-05-04 DIAGNOSIS — C61 Malignant neoplasm of prostate: Secondary | ICD-10-CM

## 2021-05-04 DIAGNOSIS — Z5189 Encounter for other specified aftercare: Secondary | ICD-10-CM | POA: Insufficient documentation

## 2021-05-04 DIAGNOSIS — Z79899 Other long term (current) drug therapy: Secondary | ICD-10-CM | POA: Insufficient documentation

## 2021-05-04 DIAGNOSIS — C7951 Secondary malignant neoplasm of bone: Secondary | ICD-10-CM | POA: Insufficient documentation

## 2021-05-04 LAB — CBC WITH DIFFERENTIAL (CANCER CENTER ONLY)
Abs Immature Granulocytes: 0.06 10*3/uL (ref 0.00–0.07)
Basophils Absolute: 0.1 10*3/uL (ref 0.0–0.1)
Basophils Relative: 1 %
Eosinophils Absolute: 0.1 10*3/uL (ref 0.0–0.5)
Eosinophils Relative: 1 %
HCT: 25.5 % — ABNORMAL LOW (ref 39.0–52.0)
Hemoglobin: 7.9 g/dL — ABNORMAL LOW (ref 13.0–17.0)
Immature Granulocytes: 1 %
Lymphocytes Relative: 13 %
Lymphs Abs: 1.1 10*3/uL (ref 0.7–4.0)
MCH: 26 pg (ref 26.0–34.0)
MCHC: 31 g/dL (ref 30.0–36.0)
MCV: 83.9 fL (ref 80.0–100.0)
Monocytes Absolute: 0.8 10*3/uL (ref 0.1–1.0)
Monocytes Relative: 10 %
Neutro Abs: 6.1 10*3/uL (ref 1.7–7.7)
Neutrophils Relative %: 74 %
Platelet Count: 259 10*3/uL (ref 150–400)
RBC: 3.04 MIL/uL — ABNORMAL LOW (ref 4.22–5.81)
RDW: 19.1 % — ABNORMAL HIGH (ref 11.5–15.5)
WBC Count: 8.1 10*3/uL (ref 4.0–10.5)
nRBC: 0 % (ref 0.0–0.2)

## 2021-05-04 LAB — CMP (CANCER CENTER ONLY)
ALT: 9 U/L (ref 0–44)
AST: 10 U/L — ABNORMAL LOW (ref 15–41)
Albumin: 3.9 g/dL (ref 3.5–5.0)
Alkaline Phosphatase: 412 U/L — ABNORMAL HIGH (ref 38–126)
Anion gap: 6 (ref 5–15)
BUN: 20 mg/dL (ref 6–20)
CO2: 26 mmol/L (ref 22–32)
Calcium: 9.5 mg/dL (ref 8.9–10.3)
Chloride: 108 mmol/L (ref 98–111)
Creatinine: 1.17 mg/dL (ref 0.61–1.24)
GFR, Estimated: >60 mL/min (ref >60)
Glucose, Bld: 163 mg/dL — ABNORMAL HIGH (ref 70–99)
Potassium: 4 mmol/L (ref 3.5–5.1)
Sodium: 140 mmol/L (ref 135–145)
Total Bilirubin: 0.3 mg/dL (ref 0.3–1.2)
Total Protein: 6.9 g/dL (ref 6.5–8.1)

## 2021-05-04 MED ORDER — OYSTER SHELL CALCIUM/D3 500-5 MG-MCG PO TABS: 1.0000 | ORAL_TABLET | Freq: Two times a day (BID) | ORAL | 3 refills | Status: DC

## 2021-05-04 MED ORDER — SODIUM CHLORIDE 0.9 % IV SOLN
10.0000 mg | Freq: Once | INTRAVENOUS | Status: AC
  Administered 2021-05-04: 10 mg via INTRAVENOUS
  Filled 2021-05-04: qty 10

## 2021-05-04 MED ORDER — SODIUM CHLORIDE 0.9 % IV SOLN: Freq: Once | INTRAVENOUS | Status: AC

## 2021-05-04 MED ORDER — SODIUM CHLORIDE 0.9 % IV SOLN
75.0000 mg/m2 | Freq: Once | INTRAVENOUS | Status: AC
  Administered 2021-05-04: 150 mg via INTRAVENOUS
  Filled 2021-05-04: qty 15

## 2021-05-04 MED ORDER — SODIUM CHLORIDE 0.9% FLUSH
10.0000 mL | INTRAVENOUS | Status: DC | PRN
  Administered 2021-05-04: 10 mL

## 2021-05-04 MED ORDER — HEPARIN SOD (PORK) LOCK FLUSH 100 UNIT/ML IV SOLN
500.0000 [IU] | Freq: Once | INTRAVENOUS | Status: AC | PRN
  Administered 2021-05-04: 500 [IU]

## 2021-05-04 MED ORDER — SODIUM CHLORIDE 0.9% FLUSH: 10.0000 mL | INTRAVENOUS | Status: DC | PRN

## 2021-05-04 MED ORDER — SODIUM CHLORIDE 0.9% FLUSH
10.0000 mL | INTRAVENOUS | Status: AC | PRN
  Administered 2021-05-04: 10 mL

## 2021-05-04 NOTE — Progress Notes (Signed)
Hematology and Oncology Follow Up Visit  Shane Jackson [id number] March 29, 1966 56 y.o. 05/04/2021 9:58 AM Pcp, Shane Sauer, MD   Principle Diagnosis: 56 year old man with advanced prostate cancer disease to the bone and lymphadenopathy diagnosed in October 2022.  He has castration-sensitive after presenting with PSA of 1234 at the time of diagnosis.   Prior Therapy:  He is status post Port-A-Cath insertion on 03/04/2021.  Current therapy:   Firmagon 240 mg started on March 06, 2021  Taxotere chemotherapy 75 mg per metered square started on March 25, 2021.  He is here for cycle 3 of therapy.  Interim History: [name] is here for return evaluation.  Since the last visit, he reports no major changes in his health.  He has tolerated chemotherapy without any pains.  He vomiting or abdominal pain.  He denies any worsening neuropathy or fatigue.  His performance status quality of life remained excellent.      Medications: Updated on review. Current Outpatient Medications  Medication Sig Dispense Refill   ferrous sulfate 325 (65 FE) MG tablet Take 1 tablet (325 mg total) by mouth daily. Start by taking one tablet every other day. Over the next 2 weeks increase your dose to once daily. (Patient not taking: Reported on 02/20/2021) 30 tablet 0   lidocaine-prilocaine (EMLA) cream Apply 1 application topically as needed. 30 g 0   prochlorperazine (COMPAZINE) 10 MG tablet Take 1 tablet (10 mg total) by mouth every 6 (six) hours as needed for nausea or vomiting. 30 tablet 0   No current facility-administered medications for this visit.     Allergies: No Known Allergies  .  Physical Exam: Blood pressure (!) 162/90, pulse 92, temperature 97.9 F (36.6 C), temperature source Axillary, resp. rate 18, height 6\' 3"  (1.905 m), weight 175 lb 8 oz (79.6 kg), SpO2 100 %.   ECOG: 1   General appearance: Alert, awake without any distress. Head: Atraumatic without  abnormalities Oropharynx: Without any thrush or ulcers. Eyes: No scleral icterus. Lymph nodes: No lymphadenopathy noted in the cervical, supraclavicular, or axillary nodes Heart:regular rate and rhythm, without any murmurs or gallops.   Lung: Clear to auscultation without any rhonchi, wheezes or dullness to percussion. Abdomin: Soft, nontender without any shifting dullness or ascites. Musculoskeletal: No clubbing or cyanosis. Neurological: No motor or sensory deficits. Skin: No rashes or lesions.      Lab Results: Lab Results  Component Value Date   WBC 7.1 04/15/2021   HGB 7.4 (L) 04/15/2021   HCT 24.0 (L) 04/15/2021   MCV 83.9 04/15/2021   PLT 359 04/15/2021     Chemistry      Component Value Date/Time   NA 139 04/15/2021 0805   NA 139 02/18/2021 1420   K 3.6 04/15/2021 0805   CL 107 04/15/2021 0805   CO2 25 04/15/2021 0805   BUN 19 04/15/2021 0805   BUN 22 02/18/2021 1420   CREATININE 1.09 04/15/2021 0805      Component Value Date/Time   CALCIUM 9.2 04/15/2021 0805   ALKPHOS 836 (H) 04/15/2021 0805   AST 19 04/15/2021 0805   ALT 16 04/15/2021 0805   BILITOT 0.3 04/15/2021 0805      Latest Reference Range & Units 03/25/21 12:07 04/15/21 08:05  Prostate Specific Ag, Serum 0.0 - 4.0 ng/mL 69.9 (H) 13.8 (H)  (H): Data is abnormally high  Impression and Plan:  56 year old with:  1.  Advanced prostate cancer disease to the bone and lymphadenopathy diagnosed in  October 2022.  He has castration-sensitive disease.  He is currently on Taxotere chemotherapy which she has tolerated very well.  PSA continues to decline indicating positive response to therapy.  Risks and benefits of continuing this treatment were reviewed at this time.  Complications including nausea, vomiting, myelosuppression and neuropathy.  Therapy escalation and antiandrogen receptor pathway inhibitors as a triple therapy were reviewed and will be deferred at this time.   He is agreeable to proceed  with chemotherapy today.   2.  Androgen deprivation: He is currently on Firmagon and will be switched to Eligard in the near future.  Complications including weight gain, hot flashes among others.   3.  IV access: Port-A-Cath inserted without any issues.   4.  Antiemetics: Compazine is available to him without any nausea or vomiting.   5.  Bone directed therapy: Delton See will be started in the future.  Complication occluding osteonecrosis of the jaw and hypocalcemia were reiterated.  We are awaiting dental clearance before starting that.  We will start on calcium supplements as agreeable to proceed with.   6.  Prognosis and goals of care: Therapy remains palliative though aggressive measures are warranted given his reasonable performance status.   7.  Anemia: Due to malignancy and chemotherapy.  Hemoglobin is improving.   8.  Hydronephrosis: Kidney function remains within normal range and will continue to monitor.  Needs a urology follow-up.   9.  Follow-up: 3 weeks for the next cycle of therapy.   30  minutes were dedicated to this visit.  The time was spent on reviewing laboratory data, disease status update, outlining future plan of care discussion.    Zola Button, MD 1/16/20239:58 AM

## 2021-05-04 NOTE — Progress Notes (Signed)
Per Alen Blew MD, pt ok to treat with HGB 7.9

## 2021-05-04 NOTE — Addendum Note (Signed)
Addended by: Wyatt Portela on: 05/04/2021 11:17 AM   Modules accepted: Orders

## 2021-05-05 ENCOUNTER — Telehealth: Payer: Self-pay | Admitting: *Deleted

## 2021-05-05 LAB — PROSTATE-SPECIFIC AG, SERUM (LABCORP): Prostate Specific Ag, Serum: 10.7 ng/mL — ABNORMAL HIGH (ref 0.0–4.0)

## 2021-05-05 NOTE — Telephone Encounter (Addendum)
-----   Message from Wyatt Portela, MD sent at 05/05/2021  8:29 AM EST ----- Please let him know his PSA is down  Contacted patient - left information as requested by Dr. Alen Blew on named voice mail and [facility] office number for any follow up questions

## 2021-05-06 ENCOUNTER — Inpatient Hospital Stay: Payer: Self-pay

## 2021-05-06 ENCOUNTER — Other Ambulatory Visit: Payer: Self-pay

## 2021-05-06 VITALS — BP 115/78 | HR 100 | Temp 98.6°F | Resp 17

## 2021-05-06 DIAGNOSIS — C61 Malignant neoplasm of prostate: Secondary | ICD-10-CM

## 2021-05-06 MED ORDER — PEGFILGRASTIM-CBQV 6 MG/0.6ML ~~LOC~~ SOSY
6.0000 mg | PREFILLED_SYRINGE | Freq: Once | SUBCUTANEOUS | Status: AC
  Administered 2021-05-06: 6 mg via SUBCUTANEOUS
  Filled 2021-05-06: qty 0.6

## 2021-05-07 NOTE — Progress Notes (Signed)
..  Patient is receiving Assistance Medication - Supplied Externally. Medication: Eligard (leuprolide acetate) Manufacture: [employer] Approval Dates: Approved from 05/07/2021 until 04/18/2022. Reason: Self Pay First DOS: 05/24/2021.  Marland KitchenJuan Jackson, CPhT IV Drug Replacement Specialist [facility] Phone: [phone]

## 2021-05-12 ENCOUNTER — Telehealth: Payer: Self-pay | Admitting: Oncology

## 2021-05-12 NOTE — Telephone Encounter (Signed)
Scheduled per 12/28 los, patient has been called and voicemail was left regarding upcoming appointment.

## 2021-05-15 ENCOUNTER — Other Ambulatory Visit: Payer: Self-pay | Admitting: *Deleted

## 2021-05-15 ENCOUNTER — Other Ambulatory Visit (HOSPITAL_COMMUNITY): Payer: Self-pay

## 2021-05-15 MED ORDER — OYSTER SHELL CALCIUM/D3 500-5 MG-MCG PO TABS
1.0000 | ORAL_TABLET | Freq: Two times a day (BID) | ORAL | 3 refills | Status: AC
  Filled 2021-05-15: qty 60, 30d supply, fill #0

## 2021-05-18 ENCOUNTER — Telehealth: Payer: Self-pay | Admitting: Oncology

## 2021-05-18 NOTE — Telephone Encounter (Signed)
Scheduled per los, patient has been called and voicemail was left regarding upcoming appointments.  

## 2021-05-26 MED FILL — Dexamethasone Sodium Phosphate Inj 100 MG/10ML: INTRAMUSCULAR | Qty: 1 | Status: AC

## 2021-05-27 ENCOUNTER — Inpatient Hospital Stay: Payer: Self-pay

## 2021-05-27 ENCOUNTER — Other Ambulatory Visit: Payer: Self-pay

## 2021-05-27 ENCOUNTER — Inpatient Hospital Stay: Payer: Self-pay | Attending: Oncology

## 2021-05-27 ENCOUNTER — Inpatient Hospital Stay (HOSPITAL_BASED_OUTPATIENT_CLINIC_OR_DEPARTMENT_OTHER): Payer: Self-pay | Admitting: Oncology

## 2021-05-27 VITALS — BP 171/101 | HR 89 | Temp 97.8°F | Resp 16 | Ht 75.0 in | Wt 177.7 lb

## 2021-05-27 VITALS — BP 154/98

## 2021-05-27 DIAGNOSIS — Z5111 Encounter for antineoplastic chemotherapy: Secondary | ICD-10-CM | POA: Insufficient documentation

## 2021-05-27 DIAGNOSIS — Z95828 Presence of other vascular implants and grafts: Secondary | ICD-10-CM

## 2021-05-27 DIAGNOSIS — C61 Malignant neoplasm of prostate: Secondary | ICD-10-CM | POA: Insufficient documentation

## 2021-05-27 DIAGNOSIS — Z5189 Encounter for other specified aftercare: Secondary | ICD-10-CM | POA: Insufficient documentation

## 2021-05-27 DIAGNOSIS — C7951 Secondary malignant neoplasm of bone: Secondary | ICD-10-CM | POA: Insufficient documentation

## 2021-05-27 DIAGNOSIS — Z79899 Other long term (current) drug therapy: Secondary | ICD-10-CM | POA: Insufficient documentation

## 2021-05-27 LAB — CBC WITH DIFFERENTIAL (CANCER CENTER ONLY)
Abs Immature Granulocytes: 0.03 10*3/uL (ref 0.00–0.07)
Basophils Absolute: 0.1 10*3/uL (ref 0.0–0.1)
Basophils Relative: 1 %
Eosinophils Absolute: 0.2 10*3/uL (ref 0.0–0.5)
Eosinophils Relative: 3 %
HCT: 27 % — ABNORMAL LOW (ref 39.0–52.0)
Hemoglobin: 8.3 g/dL — ABNORMAL LOW (ref 13.0–17.0)
Immature Granulocytes: 0 %
Lymphocytes Relative: 13 %
Lymphs Abs: 1.1 10*3/uL (ref 0.7–4.0)
MCH: 26 pg (ref 26.0–34.0)
MCHC: 30.7 g/dL (ref 30.0–36.0)
MCV: 84.6 fL (ref 80.0–100.0)
Monocytes Absolute: 1.1 10*3/uL — ABNORMAL HIGH (ref 0.1–1.0)
Monocytes Relative: 13 %
Neutro Abs: 5.9 10*3/uL (ref 1.7–7.7)
Neutrophils Relative %: 70 %
Platelet Count: 390 10*3/uL (ref 150–400)
RBC: 3.19 MIL/uL — ABNORMAL LOW (ref 4.22–5.81)
RDW: 18.4 % — ABNORMAL HIGH (ref 11.5–15.5)
WBC Count: 8.4 10*3/uL (ref 4.0–10.5)
nRBC: 0 % (ref 0.0–0.2)

## 2021-05-27 LAB — CMP (CANCER CENTER ONLY)
ALT: 9 U/L (ref 0–44)
AST: 14 U/L — ABNORMAL LOW (ref 15–41)
Albumin: 3.9 g/dL (ref 3.5–5.0)
Alkaline Phosphatase: 191 U/L — ABNORMAL HIGH (ref 38–126)
Anion gap: 5 (ref 5–15)
BUN: 20 mg/dL (ref 6–20)
CO2: 26 mmol/L (ref 22–32)
Calcium: 9.4 mg/dL (ref 8.9–10.3)
Chloride: 107 mmol/L (ref 98–111)
Creatinine: 1.08 mg/dL (ref 0.61–1.24)
GFR, Estimated: >60 mL/min (ref >60)
Glucose, Bld: 149 mg/dL — ABNORMAL HIGH (ref 70–99)
Potassium: 4 mmol/L (ref 3.5–5.1)
Sodium: 138 mmol/L (ref 135–145)
Total Bilirubin: 0.3 mg/dL (ref 0.3–1.2)
Total Protein: 6.7 g/dL (ref 6.5–8.1)

## 2021-05-27 MED ORDER — SODIUM CHLORIDE 0.9 % IV SOLN: Freq: Once | INTRAVENOUS | Status: AC

## 2021-05-27 MED ORDER — SODIUM CHLORIDE 0.9 % IV SOLN
75.0000 mg/m2 | Freq: Once | INTRAVENOUS | Status: AC
  Administered 2021-05-27: 150 mg via INTRAVENOUS
  Filled 2021-05-27: qty 15

## 2021-05-27 MED ORDER — SODIUM CHLORIDE 0.9% FLUSH
10.0000 mL | INTRAVENOUS | Status: DC | PRN
  Administered 2021-05-27: 10 mL

## 2021-05-27 MED ORDER — SODIUM CHLORIDE 0.9 % IV SOLN
10.0000 mg | Freq: Once | INTRAVENOUS | Status: AC
  Administered 2021-05-27: 10 mg via INTRAVENOUS
  Filled 2021-05-27: qty 10

## 2021-05-27 MED ORDER — HEPARIN SOD (PORK) LOCK FLUSH 100 UNIT/ML IV SOLN
500.0000 [IU] | Freq: Once | INTRAVENOUS | Status: AC | PRN
  Administered 2021-05-27: 500 [IU]

## 2021-05-27 MED ORDER — SODIUM CHLORIDE 0.9% FLUSH
10.0000 mL | INTRAVENOUS | Status: DC | PRN
  Administered 2021-05-27 (×2): 10 mL via INTRAVENOUS

## 2021-05-27 MED ORDER — LEUPROLIDE ACETATE (4 MONTH) 30 MG ~~LOC~~ KIT
30.0000 mg | PACK | Freq: Once | SUBCUTANEOUS | Status: AC
  Administered 2021-05-27: 30 mg via SUBCUTANEOUS
  Filled 2021-05-27: qty 30

## 2021-05-27 NOTE — Progress Notes (Signed)
Hematology and Oncology Follow Up Visit  Shane Jackson [id number] [birth year] 56 y.o. 05/27/2021 8:21 AM Pcp, Hoyle Sauer, MD   Principle Diagnosis: 56 year old man with castration-sensitive advanced prostate cancer with disease to the bone and lymphadenopathy diagnosed in October 2022.  He presented at the time of diagnosis with PSA of 1234.   Prior Therapy:  He is status post Port-A-Cath insertion on 03/04/2021.  Current therapy:   Firmagon 240 mg started on March 06, 2021.  He will receive Eligard today and repeated in 4 months.  Taxotere chemotherapy 75 mg/m started on March 25, 2021.  He is here for cycle 4 of therapy.  Interim History: Shane Jackson returns today for a repeat evaluation.  Since the last visit, he reports no major changes in his health.  He has tolerated the last cycle of chemotherapy without any complaints.  He denies any nausea, vomiting or abdominal pain.  He denies any recent hospitalizations or illnesses.  His performance status quality of life remains unchanged.  He denies any worsening neuropathy or infusion related complaints.  He has gained weight continues to thrive better with the chemotherapy.      Medications: Reviewed without changes. Current Outpatient Medications  Medication Sig Dispense Refill   calcium-vitamin D (OSCAL WITH D) 500-5 MG-MCG tablet Take 1 tablet by mouth 2 (two) times daily. 60 tablet 3   ferrous sulfate 325 (65 FE) MG tablet Take 1 tablet (325 mg total) by mouth daily. Start by taking one tablet every other day. Over the next 2 weeks increase your dose to once daily. (Patient not taking: Reported on 02/20/2021) 30 tablet 0   lidocaine-prilocaine (EMLA) cream Apply 1 application topically as needed. 30 g 0   prochlorperazine (COMPAZINE) 10 MG tablet Take 1 tablet (10 mg total) by mouth every 6 (six) hours as needed for nausea or vomiting. 30 tablet 0   No current facility-administered medications for this visit.      Allergies: No Known Allergies  .  Physical Exam:  Blood pressure (!) 171/101, pulse 89, temperature 97.8 F (36.6 C), temperature source Axillary, resp. rate 16, height 6\' 3"  (1.905 m), weight 177 lb 11.2 oz (80.6 kg), SpO2 100 %.   ECOG: 1    General appearance: Comfortable appearing without any discomfort Head: Normocephalic without any trauma Oropharynx: Mucous membranes are moist and pink without any thrush or ulcers. Eyes: Pupils are equal and round reactive to light. Lymph nodes: No cervical, supraclavicular, inguinal or axillary lymphadenopathy.   Heart:regular rate and rhythm.  S1 and S2 without leg edema. Lung: Clear without any rhonchi or wheezes.  No dullness to percussion. Abdomin: Soft, nontender, nondistended with good bowel sounds.  No hepatosplenomegaly. Musculoskeletal: No joint deformity or effusion.  Full range of motion noted. Neurological: No deficits noted on motor, sensory and deep tendon reflex exam. Skin: No petechial rash or dryness.  Appeared moist.        Lab Results: Lab Results  Component Value Date   WBC 8.1 05/04/2021   HGB 7.9 (L) 05/04/2021   HCT 25.5 (L) 05/04/2021   MCV 83.9 05/04/2021   PLT 259 05/04/2021     Chemistry      Component Value Date/Time   NA 140 05/04/2021 1039   NA 139 02/18/2021 1420   K 4.0 05/04/2021 1039   CL 108 05/04/2021 1039   CO2 26 05/04/2021 1039   BUN 20 05/04/2021 1039   BUN 22 02/18/2021 1420   CREATININE 1.17 05/04/2021 1039  Component Value Date/Time   CALCIUM 9.5 05/04/2021 1039   ALKPHOS 412 (H) 05/04/2021 1039   AST 10 (L) 05/04/2021 1039   ALT 9 05/04/2021 1039   BILITOT 0.3 05/04/2021 1039       Latest Reference Range & Units 03/25/21 12:07 04/15/21 08:05 05/04/21 10:39  Prostate Specific Ag, Serum 0.0 - 4.0 ng/mL 69.9 (H) 13.8 (H) 10.7 (H)  (H): Data is abnormally high   Impression and Plan:  56 year old with:  1.  Castration-sensitive advanced prostate cancer  with disease to the bone and lymphadenopathy diagnosed in October 2022.    His disease status was updated at this time and treatment choices were reviewed.  His PSA continues to decline on the current therapy and has tolerated Taxotere chemotherapy without complaints.  Complication associated with chemotherapy including nausea, vomiting, myelosuppression and neuropathy were reiterated.  Alternative treatment options and therapy escalation with androgen receptor pathway inhibitors such as abiraterone would be considered in the near future.  The time being he is agreeable to proceed.   2.  Androgen deprivation: He will receive Eligard today and repeated in 4 months.  Complications: Weight gain and hot flashes and sexual dysfunction were reviewed.   3.  IV access: Port-A-Cath remains in place and without any issues.   4.  Antiemetics: No nausea or vomiting reported at this time.  Compazine is available to him.   5.  Bone directed therapy: He is currently on calcium and vitamin D supplements.  We will start Xgeva upon obtaining dental clearance.   6.  Prognosis and goals of care: His disease is incurable although aggressive measures are warranted.   7.  Anemia: Related to malignancy and chemotherapy.  His hemoglobin continues to rise slowly with the treatment of his cancer.  His hemoglobin today reviewed and continues to show improvement.   8.  Hydronephrosis: Related to malignancy with improvement in his kidney function.   9.  Follow-up: In 3 weeks for the next cycle of therapy.   30  minutes were spent on this encounter.  The time was dedicated to reviewing laboratory data, disease status update, addressing complication related to cancer and cancer therapy.    Zola Button, MD 2/8/20238:21 AM

## 2021-05-28 ENCOUNTER — Telehealth: Payer: Self-pay | Admitting: *Deleted

## 2021-05-28 LAB — PROSTATE-SPECIFIC AG, SERUM (LABCORP): Prostate Specific Ag, Serum: 11.2 ng/mL — ABNORMAL HIGH (ref 0.0–4.0)

## 2021-05-28 NOTE — Telephone Encounter (Signed)
-----   Message from Wyatt Portela, MD sent at 05/28/2021  8:39 AM EST ----- Please let him know his PSA is slightly up but no changes noted.

## 2021-05-28 NOTE — Telephone Encounter (Signed)
Notified of message below

## 2021-05-29 ENCOUNTER — Other Ambulatory Visit: Payer: Self-pay

## 2021-05-29 ENCOUNTER — Inpatient Hospital Stay: Payer: Self-pay

## 2021-05-29 VITALS — BP 140/98 | HR 99 | Temp 98.5°F | Resp 18

## 2021-05-29 DIAGNOSIS — C61 Malignant neoplasm of prostate: Secondary | ICD-10-CM

## 2021-05-29 MED ORDER — PEGFILGRASTIM-CBQV 6 MG/0.6ML ~~LOC~~ SOSY
6.0000 mg | PREFILLED_SYRINGE | Freq: Once | SUBCUTANEOUS | Status: AC
  Administered 2021-05-29: 6 mg via SUBCUTANEOUS
  Filled 2021-05-29: qty 0.6

## 2021-06-02 ENCOUNTER — Other Ambulatory Visit: Payer: Self-pay

## 2021-06-02 ENCOUNTER — Ambulatory Visit (INDEPENDENT_AMBULATORY_CARE_PROVIDER_SITE_OTHER): Payer: Self-pay | Admitting: Dentistry

## 2021-06-02 ENCOUNTER — Encounter (HOSPITAL_COMMUNITY): Payer: Self-pay | Admitting: Dentistry

## 2021-06-02 VITALS — BP 117/76 | HR 88 | Temp 98.3°F

## 2021-06-02 DIAGNOSIS — C61 Malignant neoplasm of prostate: Secondary | ICD-10-CM

## 2021-06-02 DIAGNOSIS — F40232 Fear of other medical care: Secondary | ICD-10-CM

## 2021-06-02 DIAGNOSIS — K029 Dental caries, unspecified: Secondary | ICD-10-CM

## 2021-06-02 DIAGNOSIS — Z01818 Encounter for other preprocedural examination: Secondary | ICD-10-CM

## 2021-06-02 DIAGNOSIS — K011 Impacted teeth: Secondary | ICD-10-CM

## 2021-06-02 DIAGNOSIS — K0530 Chronic periodontitis, unspecified: Secondary | ICD-10-CM

## 2021-06-02 DIAGNOSIS — K036 Deposits [accretions] on teeth: Secondary | ICD-10-CM

## 2021-06-02 DIAGNOSIS — K030 Excessive attrition of teeth: Secondary | ICD-10-CM

## 2021-06-02 DIAGNOSIS — K08109 Complete loss of teeth, unspecified cause, unspecified class: Secondary | ICD-10-CM

## 2021-06-02 DIAGNOSIS — K045 Chronic apical periodontitis: Secondary | ICD-10-CM

## 2021-06-02 NOTE — Progress Notes (Signed)
Department of Dental Medicine   Service Date:   06/02/2021  Patient Name:  Shane Jackson Date of Birth:   11/03/1965 Medical Record Number: [id number]  Referring Provider:           Zola Button, M.D.   OUTPATIENT CONSULTATION PLAN/RECOMMENDATIONS   ASSESSMENT: There are no current signs of acute odontogenic infection including abscess, edema or erythema, or suspicious lesion requiring biopsy.   #17 & #18 severe decay, multiple other teeth w/ decay, impacted 3rd molars #1 & #16; periodontal concerns including accretions on teeth, inflammation & bone loss.  RECOMMENDATIONS: Extractions of all indicated teeth to decrease the risk of perioperative & postoperative systemic infection & complications (osteonecrosis). Establish dental care at an outside office of the patient's choice for routine care including cleanings/periodontal therapy & periodic exams.  PLAN: Refer patient to oral surgeon ([place]) for extractions due to the patient's dental phobia & complexity of #17 extraction. Discuss case with medical team. Call if any questions or concerns arise.  Discussed in detail all treatment options and recommendations with the patient and they are agreeable to the plan.    Thank you for consulting with [facility] and for the opportunity to participate in this patient's treatment.  Should you have any questions or concerns, please contact the [facility] at [phone].       06/02/2021 CONSULT NOTE:    HISTORY OF PRESENT ILLNESS: Shane Jackson is a very pleasant 56 y.o. male with h/o hypertension, type 2 diabetes mellitus, anemia, hepatitis C and metastatic prostate cancer (currently undergoing chemotherapy) and is anticipating denosumab Delton See) therapy.   The patient presents today for a medically necessary dental consultation as part of their pre-denosumab therapy work-up.   DENTAL HISTORY: The patient reports that his last visit to  the dentist was in 2020 when he had a few molars pulled including a wisdom tooth.  He currently denies any dental/orofacial pain or sensitivity. Patient is able to manage oral secretions.  Patient denies dysphagia, odynophagia, dysphonia, SOB and neck pain.   CHIEF COMPLAINT:  Here for a pre-Xgeva dental exam.   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 02/18/2021   History of hepatitis C 02/18/2021   Chronic bilateral low back pain without sciatica 02/18/2021   Primary hypertension 02/18/2021   Acute kidney injury ([place]) 02/18/2021   Past Medical History:  Diagnosis Date   Anemia    Cancer ([place])    Diabetes mellitus without complication ([place])    Hepatitis C    Hypertension    Past Surgical History:  Procedure Laterality Date   IR IMAGING GUIDED PORT INSERTION  03/04/2021   No Known Allergies Current Outpatient Medications  Medication Sig Dispense Refill   calcium-vitamin D (OSCAL WITH D) 500-5 MG-MCG tablet Take 1 tablet by mouth 2 (two) times daily. 60 tablet 3   ferrous sulfate 325 (65 FE) MG tablet Take 1 tablet (325 mg total) by mouth daily. Start by taking one tablet every other day. Over the next 2 weeks increase your dose to once daily. 30 tablet 0   lidocaine-prilocaine (EMLA) cream Apply 1 application topically as needed. 30 g 0   prochlorperazine (COMPAZINE) 10 MG tablet  Take 1 tablet (10 mg total) by mouth every 6 (six) hours as needed for nausea or vomiting. 30 tablet 0   No current facility-administered medications for this visit.    LABS: Lab Results  Component Value Date   WBC 8.4 05/27/2021   HGB 8.3 (L) 05/27/2021   HCT 27.0 (L) 05/27/2021   MCV 84.6  05/27/2021   PLT 390 05/27/2021      Component Value Date/Time   NA 138 05/27/2021 0826   NA 139 02/18/2021 1420   K 4.0 05/27/2021 0826   CL 107 05/27/2021 0826   CO2 26 05/27/2021 0826   GLUCOSE 149 (H) 05/27/2021 0826   BUN 20 05/27/2021 0826   BUN 22 02/18/2021 1420   CREATININE 1.08 05/27/2021 0826   CALCIUM 9.4 05/27/2021 0826   GFRNONAA >60 05/27/2021 0826   No results found for: INR, PROTIME No results found for: PTT  Social History   Socioeconomic History   Marital status: Single    Spouse name: Not on file   Number of children: Not on file   Years of education: Not on file   Highest education level: Not on file  Occupational History   Not on file  Tobacco Use   Smoking status: Former    Types: Cigarettes    Quit date: 11/17/1993    Years since quitting: 27.5   Smokeless tobacco: Never  Vaping Use   Vaping Use: Never used  Substance and Sexual Activity   Alcohol use: Never   Drug use: Yes    Types: Marijuana   Sexual activity: Not on file  Other Topics Concern   Not on file  Social History Narrative   Not on file   Social Determinants of Health   Financial Resource Strain: Not on file  Food Insecurity: Not on file  Transportation Needs: Not on file  Physical Activity: Not on file  Stress: Not on file  Social Connections: Not on file  Intimate Partner Violence: Not on file   Family History  Family history unknown: Yes     REVIEW OF SYSTEMS:  Reviewed with the patient as per HPI. PSYCH:  [+] Dental phobia   VITAL SIGNS: BP 117/76 (BP Location: Right Arm, Patient Position: Sitting, Cuff Size: Normal)    Pulse 88    Temp 98.3 F (36.8 C) (Oral)    PHYSICAL EXAM: GENERAL:  Well-developed, comfortable and in no apparent distress. NEUROLOGICAL:  Alert and oriented to person, place and  time. EXTRAORAL:  Facial symmetry present without any edema or erythema.  No swelling or lymphadenopathy.  TMJ asymptomatic without clicks or crepitations.   INTRAORAL:  Soft tissues appear well-perfused and mucous membranes moist.  FOM and vestibules soft and not raised. Oral cavity without mass or lesion. No signs of infection, parulis, sinus tract, edema or erythema evident upon exam. [+] Soft tissues:  Right side cheek biting trauma on buccal mucosa   DENTAL EXAM: Hard tissue exam completed and charted.    OVERALL IMPRESSION:  Poor remaining dentition.       ORAL HYGIENE:  Poor    PERIODONTAL:  Pink, healthy gingival tissue with blunted papilla with areas of localized inflamed, erythematous tissue.  Generalized calculus accumulation. CARIES:  #2, #3, #4, #5, #6, #11, #12, #14, #15, #17, #18, #19, #20, #21, #22, #28, #29, #30  PROSTHODONTICS:  Patient denies wearing partial dentures.  OCCLUSION:  Unable to assess molar occlusion.   Non-functional teeth:  #1, #2, #16, #17, #19 Supra-erupted teeth:  #  2, #19 OTHER FINDINGS:   [+] Attrition/wear:  #7 and #10 incisal, #23-#26 incisal [+] Impacted:  #1, #16, #17, #51 [+] Supernumerary teeth:  #51   RADIOGRAPHIC EXAM:  PAN and Full Mouth Series exposed and interpreted.    Condyles seated bilaterally in fossas.  No evidence of abnormal pathology.  All visualized osseous structures appear WNL.  Missing teeth #'s 13, 30, 31 & 32.  Supernumerary impacted molar upper right quadrant posterior to #1.  3rd molars #1 & #16 are bony-impacted.  #17 is partial-bony impacted & erupted mesial.  Supra-erupted teeth #'s 2 & 19.    Generalized moderate horizontal bone loss consistent with moderate periodontitis.  Radiographic calculus evident.  Mandibular molars show signs of bone loss w/ furcation involvement.  #29 vertical bone loss on mesial. Existing restorations on #18, #19 & #28.  Widened PDL space w/ potential developing periapical radiolucency on #17.  Caries:  #2M&D, #35M&D, #5D, #64M, #93M&D, #135M&D, #57M, #17 & #18 deep decay approximating the pulp, #61M&D, #21D, #28D, #109M&D,  #55M&D.   ASSESSMENT:  1.  Metastatic prostate cancer 2.  Pre-denosumab Delton See) therapy dental exam 3.  Missing teeth 4.  Caries 5.  Chronic apical periodontitis 6.  Accretions on teeth 7.  Chronic periodontitis 8.  Impacted teeth 9.  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  The discussion included the potential side effect of developing medication-related osteonecrosis of the jaw (MRONJ).  I also discussed the importance of maintaining optimum oral hygiene and oral health before, during and after denosumab Delton See) therapy.   PLAN AND RECOMMENDATIONS: I discussed the risks, benefits, and complications of various scenarios with the patient in relationship to their medical and dental conditions, which included systemic infection or other serious issues such as osteonecrosis of the jaw that could potentially occur either before, during or after their anticipated therapy if dental/oral concerns are not addressed.  I explained that if any chronic or acute dental/oral infection(s) are addressed and subsequently not maintained following medical optimization and recovery, their risk of the previously mentioned complications are just as high and could potentially occur postoperatively.  I explained all significant findings of the dental consultation with the patient which included multiple teeth with cavities, heavy calculus/tartar build-up on teeth causing periodontal issues like bone loss, bleeding and inflammation, the 3 remaining impacted wisdom teeth (plus one extra tooth in upper right quadrant), specifically tooth #17, which has severe decay and has caused deep decay on tooth #18, and the recommended care including extractions of #17 and #18 due to chronic infection as well as extractions of #1 and #16 for  preventative purposes in order to optimize them for Xgeva therapy from a dental standpoint.  Following extractions he will need 6-8 weeks of healing time prior to starting therapy.  The patient verbalized understanding of all findings, discussion, and recommendations. We then discussed various treatment options to include no treatment, multiple extractions with alveoloplasty, pre-prosthetic surgery as indicated, periodontal therapy, dental restorations, root canal therapy, crown and bridge therapy, implant therapy, and replacement of missing teeth as indicated.  The patient verbalized understanding of all options, and currently wishes to proceed with extractions of teeth #'s 17 and 18, but does not wish to extract #1 and #2 at this time if he does not have to.  Again, I discussed the reasons for preventative extractions  and the possibility of developing acute infection or pain if not addressed.  The patient verbalized understanding. Recommend referral to oral surgeon for extractions due to the patient's dental phobia and complexity of extracting #17.  The patient is agreeable to this plan and understands that the oral surgeon's office will be contacting him for an appointment soon. Plan to discuss all findings and recommendations with medical team.   The patient will need to establish care at a dental office of his choice for routine dental care including replacement of missing teeth as needed, cleanings/periodontal therapy and exams.  All questions and concerns were invited and addressed.  The patient tolerated today's visit well and departed in stable condition.  I spent in excess of 120 minutes during the conduct of this consultation and >50% of this time involved direct face-to-face encounter for counseling and/or coordination of the patient's care.   Charlaine Dalton, D.M.D.

## 2021-06-02 NOTE — Patient Instructions (Addendum)
[facility] DEPARTMENT OF DENTAL MEDICINE Dr. Debe Coder B. Benson [place], D.M.D. Phone: [phone] Fax: [phone]       It was a pleasure seeing you today!  Please refer to the information below regarding your dental visit with [place].  Please do not hesitate to give [place] a call if any questions or concerns come up after you leave.    Thank you for letting us provide care for you.  If there is anything we can do for you, please let us know.    XGEVA OR IV BISPHOSPHONATE THERAPY:  INFORMATION REGARDING YOUR TEETH        Before you start on your new therapy, there are a few things you should know: Bisphosphonate drugs appear to adversely affect the ability of the jaw bones to break down or remodel themselves, therefore reducing or eliminating their ordinary healing capacity and the ability to maintain normal health. This risk is increased after surgery, such as extractions, implant placement or other "invasive" procedures that might cause only mild trauma to the bone, or surgery on your gums, there is a significant risk of severe complications.  One of these complications includes necrosis or exposure of the bone (osteonecrosis) and subsequent soft tissue and/or bone infection may result. Once osteonecrosis begins, this is a long-term, destructive process in the jawbone that is very difficult or impossible to eliminate.    Each of these things are some of the reasons why it is so important to see a dentist before starting your therapy.  It is important to address all of your oral health needs such as taking out any bad or infected teeth with large cavities, discussing the health of your gums and taking out any teeth that are loose or may become infected in the future, and recognizing the importance of visiting the dentist every 4-6 months after starting therapy to make sure you are maintaining optimal oral health.    Questions?  Call our office during office hours at  [phone].

## 2021-06-16 MED FILL — Dexamethasone Sodium Phosphate Inj 100 MG/10ML: INTRAMUSCULAR | Qty: 1 | Status: AC

## 2021-06-17 ENCOUNTER — Telehealth: Payer: Self-pay

## 2021-06-17 ENCOUNTER — Other Ambulatory Visit: Payer: Self-pay

## 2021-06-17 ENCOUNTER — Inpatient Hospital Stay (HOSPITAL_BASED_OUTPATIENT_CLINIC_OR_DEPARTMENT_OTHER): Payer: Medicaid Other | Admitting: Oncology

## 2021-06-17 ENCOUNTER — Inpatient Hospital Stay: Payer: Medicaid Other

## 2021-06-17 ENCOUNTER — Other Ambulatory Visit (HOSPITAL_COMMUNITY): Payer: Self-pay

## 2021-06-17 ENCOUNTER — Inpatient Hospital Stay: Payer: Medicaid Other | Attending: Oncology

## 2021-06-17 VITALS — BP 160/93 | HR 89 | Temp 100.0°F | Resp 17 | Ht 75.0 in | Wt 185.4 lb

## 2021-06-17 DIAGNOSIS — C7951 Secondary malignant neoplasm of bone: Secondary | ICD-10-CM | POA: Insufficient documentation

## 2021-06-17 DIAGNOSIS — C61 Malignant neoplasm of prostate: Secondary | ICD-10-CM

## 2021-06-17 DIAGNOSIS — Z79899 Other long term (current) drug therapy: Secondary | ICD-10-CM | POA: Insufficient documentation

## 2021-06-17 DIAGNOSIS — Z5111 Encounter for antineoplastic chemotherapy: Secondary | ICD-10-CM | POA: Diagnosis present

## 2021-06-17 DIAGNOSIS — Z95828 Presence of other vascular implants and grafts: Secondary | ICD-10-CM

## 2021-06-17 LAB — CBC WITH DIFFERENTIAL (CANCER CENTER ONLY)
Abs Immature Granulocytes: 0.02 10*3/uL (ref 0.00–0.07)
Basophils Absolute: 0.1 10*3/uL (ref 0.0–0.1)
Basophils Relative: 1 %
Eosinophils Absolute: 0.1 10*3/uL (ref 0.0–0.5)
Eosinophils Relative: 1 %
HCT: 28.8 % — ABNORMAL LOW (ref 39.0–52.0)
Hemoglobin: 8.9 g/dL — ABNORMAL LOW (ref 13.0–17.0)
Immature Granulocytes: 0 %
Lymphocytes Relative: 16 %
Lymphs Abs: 1.1 10*3/uL (ref 0.7–4.0)
MCH: 26.2 pg (ref 26.0–34.0)
MCHC: 30.9 g/dL (ref 30.0–36.0)
MCV: 84.7 fL (ref 80.0–100.0)
Monocytes Absolute: 0.8 10*3/uL (ref 0.1–1.0)
Monocytes Relative: 12 %
Neutro Abs: 4.7 10*3/uL (ref 1.7–7.7)
Neutrophils Relative %: 70 %
Platelet Count: 375 10*3/uL (ref 150–400)
RBC: 3.4 MIL/uL — ABNORMAL LOW (ref 4.22–5.81)
RDW: 17.5 % — ABNORMAL HIGH (ref 11.5–15.5)
WBC Count: 6.7 10*3/uL (ref 4.0–10.5)
nRBC: 0 % (ref 0.0–0.2)

## 2021-06-17 LAB — CMP (CANCER CENTER ONLY)
ALT: 9 U/L (ref 0–44)
AST: 15 U/L (ref 15–41)
Albumin: 3.8 g/dL (ref 3.5–5.0)
Alkaline Phosphatase: 140 U/L — ABNORMAL HIGH (ref 38–126)
Anion gap: 6 (ref 5–15)
BUN: 19 mg/dL (ref 6–20)
CO2: 27 mmol/L (ref 22–32)
Calcium: 9.3 mg/dL (ref 8.9–10.3)
Chloride: 106 mmol/L (ref 98–111)
Creatinine: 1.1 mg/dL (ref 0.61–1.24)
GFR, Estimated: >60 mL/min (ref >60)
Glucose, Bld: 193 mg/dL — ABNORMAL HIGH (ref 70–99)
Potassium: 3.9 mmol/L (ref 3.5–5.1)
Sodium: 139 mmol/L (ref 135–145)
Total Bilirubin: 0.3 mg/dL (ref 0.3–1.2)
Total Protein: 6.7 g/dL (ref 6.5–8.1)

## 2021-06-17 MED ORDER — SODIUM CHLORIDE 0.9 % IV SOLN
75.0000 mg/m2 | Freq: Once | INTRAVENOUS | Status: AC
  Administered 2021-06-17: 150 mg via INTRAVENOUS
  Filled 2021-06-17: qty 15

## 2021-06-17 MED ORDER — PREDNISONE 5 MG PO TABS: 5.0000 mg | ORAL_TABLET | Freq: Every day | ORAL | 1 refills | Status: DC

## 2021-06-17 MED ORDER — ABIRATERONE ACETATE 250 MG PO TABS
1000.0000 mg | ORAL_TABLET | Freq: Every day | ORAL | 0 refills | Status: DC
  Filled 2021-06-17: qty 120, 30d supply, fill #0

## 2021-06-17 MED ORDER — DAROLUTAMIDE 300 MG PO TABS
600.0000 mg | ORAL_TABLET | Freq: Two times a day (BID) | ORAL | 1 refills | Status: DC
  Filled 2021-06-17: qty 120, 30d supply, fill #0

## 2021-06-17 MED ORDER — SODIUM CHLORIDE 0.9 % IV SOLN: Freq: Once | INTRAVENOUS | Status: AC

## 2021-06-17 MED ORDER — SODIUM CHLORIDE 0.9% FLUSH
10.0000 mL | INTRAVENOUS | Status: DC | PRN
  Administered 2021-06-17: 10 mL

## 2021-06-17 MED ORDER — SODIUM CHLORIDE 0.9% FLUSH
10.0000 mL | INTRAVENOUS | Status: AC | PRN
  Administered 2021-06-17: 10 mL

## 2021-06-17 MED ORDER — HEPARIN SOD (PORK) LOCK FLUSH 100 UNIT/ML IV SOLN
500.0000 [IU] | Freq: Once | INTRAVENOUS | Status: AC | PRN
  Administered 2021-06-17: 500 [IU]

## 2021-06-17 MED ORDER — SODIUM CHLORIDE 0.9 % IV SOLN
10.0000 mg | Freq: Once | INTRAVENOUS | Status: AC
  Administered 2021-06-17: 10 mg via INTRAVENOUS
  Filled 2021-06-17: qty 10

## 2021-06-17 NOTE — Addendum Note (Signed)
Addended by: Wyatt Portela on: 06/17/2021 10:33 AM ? ? Modules accepted: Orders ? ?

## 2021-06-17 NOTE — Patient Instructions (Signed)
[facility] MEDICAL ONCOLOGY   ?Discharge Instructions: ?Thank you for choosing [facility] to provide your oncology and hematology care.  ? ?If you have a lab appointment with the Cancer Center, please go directly to the Cancer Center and check in at the registration area. ?  ?Wear comfortable clothing and clothing appropriate for easy access to any Portacath or PICC line.  ? ?We strive to give you quality time with your provider. You may need to reschedule your appointment if you arrive late (15 or more minutes).  Arriving late affects you and other patients whose appointments are after yours.  Also, if you miss three or more appointments without notifying the office, you may be dismissed from the clinic at the provider?s discretion.    ?  ?For prescription refill requests, have your pharmacy contact our office and allow 72 hours for refills to be completed.   ? ?Today you received the following chemotherapy and/or immunotherapy agents: docetaxel    ?  ?To help prevent nausea and vomiting after your treatment, we encourage you to take your nausea medication as directed. ? ?BELOW ARE SYMPTOMS THAT SHOULD BE REPORTED IMMEDIATELY: ?*FEVER GREATER THAN 100.4 F (38 ?C) OR HIGHER ?*CHILLS OR SWEATING ?*NAUSEA AND VOMITING THAT IS NOT CONTROLLED WITH YOUR NAUSEA MEDICATION ?*UNUSUAL SHORTNESS OF BREATH ?*UNUSUAL BRUISING OR BLEEDING ?*URINARY PROBLEMS (pain or burning when urinating, or frequent urination) ?*BOWEL PROBLEMS (unusual diarrhea, constipation, pain near the anus) ?TENDERNESS IN MOUTH AND THROAT WITH OR WITHOUT PRESENCE OF ULCERS (sore throat, sores in mouth, or a toothache) ?UNUSUAL RASH, SWELLING OR PAIN  ?UNUSUAL VAGINAL DISCHARGE OR ITCHING  ? ?Items with * indicate a potential emergency and should be followed up as soon as possible or go to the Emergency Department if any problems should occur. ? ?Please show the CHEMOTHERAPY ALERT CARD or IMMUNOTHERAPY ALERT CARD at check-in  to the Emergency Department and triage nurse. ? ?Should you have questions after your visit or need to cancel or reschedule your appointment, please contact [facility] MEDICAL [facility]COL[facility]  Dept: [phone]  and follow the prompts.  Office hours are 8:00 a.m. to 4:30 p.m. Monday - Friday. Please note that voicemails left after 4:00 p.m. may not be returned until the following business day.  We are closed weekends and major holidays. You have access to a nurse at all times for urgent questions. Please call the main number to the clinic Dept: [phone] and follow the prompts. ? ? ?For any non-urgent questions, you may also contact your provider using MyChart. We now offer e-Visits for anyone 18 and older to request care online for non-urgent symptoms. For details visit [email or web address]. ?  ?Also download the MyChart app! Go to the app store, search "MyChart", open the app, select [facility], and log in with your MyChart username and password. ? ?Due to Covid, a mask is required upon entering the hospital/clinic. If you do not have a mask, one will be given to you upon arrival. For doctor visits, patients may have 1 support person aged 18 or older with them. For treatment visits, patients cannot have anyone with them due to current Covid guidelines and our immunocompromised population.  ? ?

## 2021-06-17 NOTE — Telephone Encounter (Addendum)
Oral Oncology Pharmacist Encounter  Received new prescription for darolutamide Merleen Nicely) for the treatment of advanced, castration-sensitive prostate cancer in conjunction with docetaxel, planned duration until disease progression or unacceptable toxicity.  Patient does not have prescription insurance at this time, medication changed from zytiga to nubeqa.   Labs from 06/17/21 assessed, no interventions needed.  Current medication list in Epic reviewed, DDIs with Nubeqa identified: none  Evaluated chart and no patient barriers to medication adherence noted.   Patient agreement for treatment documented in MD note on 06/17/2021.  Prescription has been e-scribed to the [facility] for benefits analysis and approval.  Oral Oncology Clinic will continue to follow for insurance authorization, copayment issues, initial counseling and start date.  Bethel Born, PharmD Hematology/Oncology Clinical Pharmacist [facility] Oral Chemotherapy Navigation [facility] [phone] 06/17/2021 10:44 AM

## 2021-06-17 NOTE — Progress Notes (Signed)
Hematology and Oncology Follow Up Visit ? ?Makari Portman ?[id number] ?May 18, 1965 56 y.o. ?06/17/2021 8:20 AM ?Connye Burkitt, MD  ? 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  He did with castration-sensitive and PSA of 1234 at that time. ? ? ?Prior Therapy: ? ?He is status post Port-A-Cath insertion on 03/04/2021. ? ?Current therapy:  ? ?Firmagon 240 mg started on March 06, 2021.  He will receive Eligard today and repeated in 4 months. ? ?Taxotere chemotherapy 75 mg/m? started on March 25, 2021.  He is here for cycle 5 of therapy. ? ?Interim History: Mr. Caesar presents today for a follow-up visit.  Since last visit, he reports no major changes in his health.  He denies any recent hospitalizations or illnesses.  He denies any complications related to Taxotere.  His performance status quality of life remained excellent.  He denies any worsening neuropathy, fatigue or nausea.  He continues to gain weight and no longer reporting any bone pain. ? ?  ? ? ?Medications: Updated on review. ?Current Outpatient Medications  ?Medication Sig Dispense Refill  ? calcium-vitamin D (OSCAL WITH D) 500-5 MG-MCG tablet Take 1 tablet by mouth 2 (two) times daily. 60 tablet 3  ? ferrous sulfate 325 (65 FE) MG tablet Take 1 tablet (325 mg total) by mouth daily. Start by taking one tablet every other day. Over the next 2 weeks increase your dose to once daily. 30 tablet 0  ? lidocaine-prilocaine (EMLA) cream Apply 1 application topically as needed. 30 g 0  ? prochlorperazine (COMPAZINE) 10 MG tablet Take 1 tablet (10 mg total) by mouth every 6 (six) hours as needed for nausea or vomiting. 30 tablet 0  ? ?No current facility-administered medications for this visit.  ? ? ? ?Allergies: No Known Allergies ? ?. ? ?Physical Exam: ? ?Blood pressure (!) 160/93, pulse 89, temperature 100 ?F (37.8 ?C), temperature source Axillary, resp.  rate 17, height 6\' 3"  (1.905 m), weight 185 lb 6.4 oz (84.1 kg), SpO2 100 %. ? ? ? ?ECOG: 1 ? ? ?General appearance: Alert, awake without any distress. ?Head: Atraumatic without abnormalities ?Oropharynx: Without any thrush or ulcers. ?Eyes: No scleral icterus. ?Lymph nodes: No lymphadenopathy noted in the cervical, supraclavicular, or axillary nodes ?Heart:regular rate and rhythm, without any murmurs or gallops.   ?Lung: Clear to auscultation without any rhonchi, wheezes or dullness to percussion. ?Abdomin: Soft, nontender without any shifting dullness or ascites. ?Musculoskeletal: No clubbing or cyanosis. ?Neurological: No motor or sensory deficits. ?Skin: No rashes or lesions. ? ? ? ? ? ? ?Lab Results: ?Lab Results  ?Component Value Date  ? WBC 8.4 05/27/2021  ? HGB 8.3 (L) 05/27/2021  ? HCT 27.0 (L) 05/27/2021  ? MCV 84.6 05/27/2021  ? PLT 390 05/27/2021  ? ?  Chemistry   ?   ?Component Value Date/Time  ? NA 138 05/27/2021 0826  ? NA 139 02/18/2021 1420  ? K 4.0 05/27/2021 0826  ? CL 107 05/27/2021 0826  ? CO2 26 05/27/2021 0826  ? BUN 20 05/27/2021 0826  ? BUN 22 02/18/2021 1420  ? CREATININE 1.08 05/27/2021 0826  ?    ?Component Value Date/Time  ? CALCIUM 9.4 05/27/2021 0826  ? ALKPHOS 191 (H) 05/27/2021 4098  ? AST 14 (L) 05/27/2021 0826  ? ALT 9 05/27/2021 0826  ? BILITOT 0.3 05/27/2021 0826  ?  ? ? Latest Reference Range & Units 03/25/21 12:07  04/15/21 08:05 05/04/21 10:39 05/27/21 08:26  ?Prostate Specific Ag, Serum 0.0 - 4.0 ng/mL 69.9 (H) 13.8 (H) 10.7 (H) 11.2 (H)  ?(H): Data is abnormally high ? ? ?Impression and Plan: ? ?56 year old with: ? ?1.  Advanced prostate cancer with disease to the bone and lymphadenopathy diagnosed in October 2022.    ? ?He is currently on Taxotere chemotherapy with excellent PSA response although there is a slight increase in the last PSA.  Risks and benefits of continuing Taxotere chemotherapy were reviewed.  Therapy escalation with adding abiraterone and prednisone to his  regimen were discussed at this time.  Given his high risk, high-volume disease and PSA plateau I am in favor of proceeding with this therapy sooner than later.  Complication including hypertension, weight gain and arthralgias among others were discussed.  Alternative treatment options including enzalutamide and darolutamide can be considered as well. ? ?He is agreeable to proceed at this time I will initiate this therapy as soon as possible. ? ?  ?2.  Androgen deprivation: He received Eligard in February and will be repeated in June 2023. ?  ?3.  IV access: Port-A-Cath continues to be in use without any issues. ?  ?4.  Antiemetics: Compazine is available to him without any nausea or vomiting. ?  ?5.  Bone directed therapy: I recommended Xgeva after obtaining dental clearance. ?  ?6.  Prognosis and goals of care: Therapy remains palliative though aggressive measures are warranted given his excellent performance status. ?  ?7.  Anemia: Multifactorial in nature related to malignancy and chemotherapy.  Hemoglobin continues to improve slightly. ?  ?8.  Hydronephrosis: Remains asymptomatic at this time with normalization of his kidney function. ?  ?9.  Follow-up: He will return in 3 weeks for a follow-up visit.   ?  ?30  minutes were dedicated to this visit.  The time was spent on reviewing laboratory data, disease status update and outlining future plan of care review. ? ? ? ?Zola Button, MD ?3/1/20238:20 AM ? ?

## 2021-06-18 ENCOUNTER — Telehealth: Payer: Self-pay | Admitting: *Deleted

## 2021-06-18 LAB — PROSTATE-SPECIFIC AG, SERUM (LABCORP): Prostate Specific Ag, Serum: 10.8 ng/mL — ABNORMAL HIGH (ref 0.0–4.0)

## 2021-06-18 NOTE — Telephone Encounter (Signed)
-----   Message from Wyatt Portela, MD sent at 06/18/2021  9:30 AM EST ----- ?Please let him know his PSA is down ?

## 2021-06-18 NOTE — Telephone Encounter (Signed)
Per Dr.Shadad, called pt with message below. Pt verbalized understanding ?

## 2021-06-19 ENCOUNTER — Inpatient Hospital Stay: Payer: Medicaid Other

## 2021-06-19 ENCOUNTER — Other Ambulatory Visit: Payer: Self-pay

## 2021-06-19 VITALS — BP 122/88 | HR 93 | Temp 98.1°F | Resp 18

## 2021-06-19 DIAGNOSIS — Z5111 Encounter for antineoplastic chemotherapy: Secondary | ICD-10-CM | POA: Diagnosis not present

## 2021-06-19 DIAGNOSIS — C61 Malignant neoplasm of prostate: Secondary | ICD-10-CM

## 2021-06-19 MED ORDER — PEGFILGRASTIM-CBQV 6 MG/0.6ML ~~LOC~~ SOSY
6.0000 mg | PREFILLED_SYRINGE | Freq: Once | SUBCUTANEOUS | Status: AC
  Administered 2021-06-19: 6 mg via SUBCUTANEOUS
  Filled 2021-06-19: qty 0.6

## 2021-06-19 NOTE — Patient Instructions (Signed)

## 2021-06-26 ENCOUNTER — Telehealth: Payer: Self-pay

## 2021-06-26 NOTE — Telephone Encounter (Signed)
Oral Oncology Patient Advocate Encounter ? ?Met patient in lobby room to complete application for [employer] in an effort to reduce patient's out of pocket expense for Nubeqa to $0.   ? ?Application completed and faxed to [phone].  ? ?[employer] patient assistance phone number for follow up is [phone].  ? ?This encounter will be updated until final determination.  ? ? ?Wynn Maudlin CPHT ?Specialty Pharmacy Patient Advocate ?[place] ?Phone [phone] ?Fax [phone] ?06/26/2021 11:40 AM  ?

## 2021-07-07 NOTE — Telephone Encounter (Signed)
Patient is approved for Nubeqa at no cost from [employer] 07/07/21-07/08/22 ? ?Manufacturing systems engineer uses Psychologist, occupational by [employer] ? ?Wynn Maudlin CPHT ?Specialty Pharmacy Patient Advocate ?[place] ?Phone [phone] ?Fax [phone] ?07/07/2021 9:44 AM ? ?

## 2021-07-08 ENCOUNTER — Other Ambulatory Visit: Payer: Self-pay

## 2021-07-08 ENCOUNTER — Inpatient Hospital Stay: Payer: Medicaid Other

## 2021-07-08 ENCOUNTER — Inpatient Hospital Stay (HOSPITAL_BASED_OUTPATIENT_CLINIC_OR_DEPARTMENT_OTHER): Payer: Medicaid Other | Admitting: Oncology

## 2021-07-08 VITALS — BP 169/89 | HR 89 | Temp 98.0°F | Resp 17 | Ht 75.0 in | Wt 186.3 lb

## 2021-07-08 DIAGNOSIS — C61 Malignant neoplasm of prostate: Secondary | ICD-10-CM

## 2021-07-08 DIAGNOSIS — Z5111 Encounter for antineoplastic chemotherapy: Secondary | ICD-10-CM | POA: Diagnosis not present

## 2021-07-08 DIAGNOSIS — Z95828 Presence of other vascular implants and grafts: Secondary | ICD-10-CM

## 2021-07-08 LAB — CBC WITH DIFFERENTIAL (CANCER CENTER ONLY)
Abs Immature Granulocytes: 0.03 10*3/uL (ref 0.00–0.07)
Basophils Absolute: 0.1 10*3/uL (ref 0.0–0.1)
Basophils Relative: 1 %
Eosinophils Absolute: 0.1 10*3/uL (ref 0.0–0.5)
Eosinophils Relative: 1 %
HCT: 31.3 % — ABNORMAL LOW (ref 39.0–52.0)
Hemoglobin: 9.5 g/dL — ABNORMAL LOW (ref 13.0–17.0)
Immature Granulocytes: 0 %
Lymphocytes Relative: 12 %
Lymphs Abs: 1 10*3/uL (ref 0.7–4.0)
MCH: 25.9 pg — ABNORMAL LOW (ref 26.0–34.0)
MCHC: 30.4 g/dL (ref 30.0–36.0)
MCV: 85.3 fL (ref 80.0–100.0)
Monocytes Absolute: 1 10*3/uL (ref 0.1–1.0)
Monocytes Relative: 12 %
Neutro Abs: 6.1 10*3/uL (ref 1.7–7.7)
Neutrophils Relative %: 74 %
Platelet Count: 317 10*3/uL (ref 150–400)
RBC: 3.67 MIL/uL — ABNORMAL LOW (ref 4.22–5.81)
RDW: 16.6 % — ABNORMAL HIGH (ref 11.5–15.5)
WBC Count: 8.3 10*3/uL (ref 4.0–10.5)
nRBC: 0 % (ref 0.0–0.2)

## 2021-07-08 LAB — CMP (CANCER CENTER ONLY)
ALT: 8 U/L (ref 0–44)
AST: 12 U/L — ABNORMAL LOW (ref 15–41)
Albumin: 3.9 g/dL (ref 3.5–5.0)
Alkaline Phosphatase: 118 U/L (ref 38–126)
Anion gap: 6 (ref 5–15)
BUN: 20 mg/dL (ref 6–20)
CO2: 26 mmol/L (ref 22–32)
Calcium: 9.5 mg/dL (ref 8.9–10.3)
Chloride: 107 mmol/L (ref 98–111)
Creatinine: 1.25 mg/dL — ABNORMAL HIGH (ref 0.61–1.24)
GFR, Estimated: >60 mL/min (ref >60)
Glucose, Bld: 198 mg/dL — ABNORMAL HIGH (ref 70–99)
Potassium: 4 mmol/L (ref 3.5–5.1)
Sodium: 139 mmol/L (ref 135–145)
Total Bilirubin: 0.3 mg/dL (ref 0.3–1.2)
Total Protein: 6.7 g/dL (ref 6.5–8.1)

## 2021-07-08 MED ORDER — SODIUM CHLORIDE 0.9% FLUSH
10.0000 mL | INTRAVENOUS | Status: AC | PRN
  Administered 2021-07-08: 10 mL

## 2021-07-08 MED ORDER — SODIUM CHLORIDE 0.9 % IV SOLN: Freq: Once | INTRAVENOUS | Status: AC

## 2021-07-08 MED ORDER — SODIUM CHLORIDE 0.9 % IV SOLN
10.0000 mg | Freq: Once | INTRAVENOUS | Status: AC
  Administered 2021-07-08: 10 mg via INTRAVENOUS
  Filled 2021-07-08: qty 10

## 2021-07-08 MED ORDER — SODIUM CHLORIDE 0.9 % IV SOLN
75.0000 mg/m2 | Freq: Once | INTRAVENOUS | Status: AC
  Administered 2021-07-08: 150 mg via INTRAVENOUS
  Filled 2021-07-08: qty 15

## 2021-07-08 NOTE — Patient Instructions (Signed)
[facility] MEDICAL [facility]COL[facility]   ?Discharge Instructions: ?Thank you for choosing [facility] to provide your oncology and hematology care.  ? ?If you have a lab appointment with the Cancer Center, please go directly to the Cancer Center and check in at the registration area. ?  ?Wear comfortable clothing and clothing appropriate for easy access to any Portacath or PICC line.  ? ?We strive to give you quality time with your provider. You may need to reschedule your appointment if you arrive late (15 or more minutes).  Arriving late affects you and other patients whose appointments are after yours.  Also, if you miss three or more appointments without notifying the office, you may be dismissed from the clinic at the provider?s discretion.    ?  ?For prescription refill requests, have your pharmacy contact our office and allow 72 hours for refills to be completed.   ? ?Today you received the following chemotherapy and/or immunotherapy agents: docetaxel    ?  ?To help prevent nausea and vomiting after your treatment, we encourage you to take your nausea medication as directed. ? ?BELOW ARE SYMPTOMS THAT SHOULD BE REPORTED IMMEDIATELY: ?*FEVER GREATER THAN 100.4 F (38 ?C) OR HIGHER ?*CHILLS OR SWEATING ?*NAUSEA AND VOMITING THAT IS NOT CONTROLLED WITH YOUR NAUSEA MEDICATION ?*UNUSUAL SHORTNESS OF BREATH ?*UNUSUAL BRUISING OR BLEEDING ?*URINARY PROBLEMS (pain or burning when urinating, or frequent urination) ?*BOWEL PROBLEMS (unusual diarrhea, constipation, pain near the anus) ?TENDERNESS IN MOUTH AND THROAT WITH OR WITHOUT PRESENCE OF ULCERS (sore throat, sores in mouth, or a toothache) ?UNUSUAL RASH, SWELLING OR PAIN  ?UNUSUAL VAGINAL DISCHARGE OR ITCHING  ? ?Items with * indicate a potential emergency and should be followed up as soon as possible or go to the Emergency Department if any problems should occur. ? ?Please show the CHEMOTHERAPY ALERT CARD or IMMUNOTHERAPY ALERT CARD at check-in  to the Emergency Department and triage nurse. ? ?Should you have questions after your visit or need to cancel or reschedule your appointment, please contact [facility]  Dept: [phone]  and follow the prompts.  Office hours are 8:00 a.m. to 4:30 p.m. Monday - Friday. Please note that voicemails left after 4:00 p.m. may not be returned until the following business day.  We are closed weekends and major holidays. You have access to a nurse at all times for urgent questions. Please call the main number to the clinic Dept: [phone] and follow the prompts. ? ? ?For any non-urgent questions, you may also contact your provider using MyChart. We now offer e-Visits for anyone 18 and older to request care online for non-urgent symptoms. For details visit [email or web address]. ?  ?Also download the MyChart app! Go to the app store, search "MyChart", open the app, select [facility], and log in with your MyChart username and password. ? ?Due to Covid, a mask is required upon entering the hospital/clinic. If you do not have a mask, one will be given to you upon arrival. For doctor visits, patients may have 1 support person aged 18 or older with them. For treatment visits, patients cannot have anyone with them due to current Covid guidelines and our immunocompromised population.  ? ?

## 2021-07-08 NOTE — Progress Notes (Signed)
Hematology and Oncology Follow Up Visit ? ?Shane Jackson ?[id number] ?11-10-1965 56 y.o. ?07/08/2021 8:06 AM ?Connye Burkitt, MD  ? ?Principle Diagnosis: 56 year old man with castration-sensitive advanced prostate cancer with disease to the bone and lymphadenopathy diagnosed in October 2022 after presenting with PSA of 1234. ? ? ?Prior Therapy: ? ?He is status post Port-A-Cath insertion on 03/04/2021. ? ?Firmagon 240 mg started on March 06, 2021.   ? ?Current therapy:  ? ?Under consideration to start Nubeqa  ? ?Eligard 30 mg every 4 months started on May 27, 2021.  Next injection will be given in June 2023. ? ?Taxotere chemotherapy 75 mg/m? started on March 25, 2021.  He is here for cycle 6 of therapy. ? ?Interim History: Mr. Hoel returns today for repeat evaluation.  Since last visit, he reports no major changes in his health.  He has tolerated the last cycle of chemotherapy without any major complaints.  He denies any nausea, vomiting or abdominal pain.  He denies any hospitalizations or illnesses.  He denies any worsening neuropathy. ? ?  ? ? ?Medications: Reviewed without changes.  ?Current Outpatient Medications  ?Medication Sig Dispense Refill  ? calcium-vitamin D (OSCAL WITH D) 500-5 MG-MCG tablet Take 1 tablet by mouth 2 (two) times daily. 60 tablet 3  ? darolutamide (NUBEQA) 300 MG tablet Take 2 tablets (600 mg total) by mouth 2 (two) times daily with a meal. 120 tablet 1  ? ferrous sulfate 325 (65 FE) MG tablet Take 1 tablet (325 mg total) by mouth daily. Start by taking one tablet every other day. Over the next 2 weeks increase your dose to once daily. 30 tablet 0  ? lidocaine-prilocaine (EMLA) cream Apply 1 application topically as needed. 30 g 0  ? predniSONE (DELTASONE) 5 MG tablet Take 1 tablet (5 mg total) by mouth daily with breakfast. 90 tablet 1  ? prochlorperazine (COMPAZINE) 10 MG tablet Take 1 tablet (10 mg total) by mouth every 6 (six) hours as needed for nausea or vomiting.  30 tablet 0  ? ?No current facility-administered medications for this visit.  ? ? ? ?Allergies: No Known Allergies ? ?. ? ?Physical Exam: ? ?Blood pressure (!) 169/89, pulse 89, temperature 98 ?F (36.7 ?C), temperature source Temporal, resp. rate 17, height '6\' 3"'$  (1.905 m), weight 186 lb 4.8 oz (84.5 kg), SpO2 100 %. ? ? ? ? ?ECOG: 1 ? ? ? ?General appearance: Comfortable appearing without any discomfort ?Head: Normocephalic without any trauma ?Oropharynx: Mucous membranes are moist and pink without any thrush or ulcers. ?Eyes: Pupils are equal and round reactive to light. ?Lymph nodes: No cervical, supraclavicular, inguinal or axillary lymphadenopathy.   ?Heart:regular rate and rhythm.  S1 and S2 without leg edema. ?Lung: Clear without any rhonchi or wheezes.  No dullness to percussion. ?Abdomin: Soft, nontender, nondistended with good bowel sounds.  No hepatosplenomegaly. ?Musculoskeletal: No joint deformity or effusion.  Full range of motion noted. ?Neurological: No deficits noted on motor, sensory and deep tendon reflex exam. ?Skin: No petechial rash or dryness.  Appeared moist.  ? ? ? ? ? ? ? ?Lab Results: ?Lab Results  ?Component Value Date  ? WBC 6.7 06/17/2021  ? HGB 8.9 (L) 06/17/2021  ? HCT 28.8 (L) 06/17/2021  ? MCV 84.7 06/17/2021  ? PLT 375 06/17/2021  ? ?  Chemistry   ?   ?Component Value Date/Time  ? NA 139 06/17/2021 0811  ? NA 139 02/18/2021 1420  ? K 3.9 06/17/2021 0811  ?  CL 106 06/17/2021 0811  ? CO2 27 06/17/2021 0811  ? BUN 19 06/17/2021 0811  ? BUN 22 02/18/2021 1420  ? CREATININE 1.10 06/17/2021 0811  ?    ?Component Value Date/Time  ? CALCIUM 9.3 06/17/2021 0811  ? ALKPHOS 140 (H) 06/17/2021 3570  ? AST 15 06/17/2021 0811  ? ALT 9 06/17/2021 0811  ? BILITOT 0.3 06/17/2021 0811  ?  ? ? ? Latest Reference Range & Units 04/15/21 08:05 05/04/21 10:39 05/27/21 08:26 06/17/21 08:11  ?Prostate Specific Ag, Serum 0.0 - 4.0 ng/mL 13.8 (H) 10.7 (H) 11.2 (H) 10.8 (H)  ? ? ?Impression and  Plan: ? ?56 year old with: ? ?1.  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    ? ?Risks and benefits of continuing Taxotere chemotherapy were discussed at this time.  Complications that include nausea, vomiting, mild suppression, neutropenia and possible sepsis were reiterated.  I have recommended proceeding with androgen receptor pathway inhibitors and he is willing to proceed.  Due to insurance coverage as he is about to start Nubeqa in the near future.  Complication associated with this medication were reiterated.  These include fatigue, tiredness, GI toxicity, arthralgias among others.  He is agreeable to proceed in the near future.  In the meantime he will finish cycle 6 of chemotherapy. ? ?  ?2.  Androgen deprivation: Next Eligard will be given in June 2023.  Long-term complication including weight gain and hot flashes among others were reiterated. ?  ?3.  IV access: Port-A-Cath remains in use without complications. ?  ?4.  Antiemetics: No nausea or vomiting reported at this time.  Compazine is available to him. ?  ?5.  Bone directed therapy: Risks and benefits of starting Xgeva were discussed at this time.  Recommended starting calcium and vitamin D supplements and obtaining dental clearance before proceeding. ?  ?6.  Prognosis and goals of care: His disease is incurable but aggressive measures are warranted even if the treatment is palliative. ?  ?7.  Anemia: His hemoglobin continues to improve with treatment.  His anemia is related to malignancy. ?  ?8.  Hydronephrosis: Related to malignancy but kidney function remains stable at this time. ?  ?9.  Follow-up: In the next 3 to 4 weeks for repeat evaluation. ?  ?30  minutes were spent on this encounter.  The time was dedicated to reviewing laboratory data, disease status update and outlining future plan of care discussion. ? ? ? ?Zola Button, MD ?3/22/20238:06 AM ? ?

## 2021-07-09 ENCOUNTER — Telehealth: Payer: Self-pay | Admitting: *Deleted

## 2021-07-09 LAB — PROSTATE-SPECIFIC AG, SERUM (LABCORP): Prostate Specific Ag, Serum: 9.7 ng/mL — ABNORMAL HIGH (ref 0.0–4.0)

## 2021-07-09 NOTE — Telephone Encounter (Addendum)
Oral Chemotherapy Pharmacist Encounter ? ?I spoke with patient for overview of: Nubeqa for the treatment of castration-sensitive prostate cancer, planned duration until disease progression or unacceptable toxicity.  ? ?Counseled patient on administration, dosing, side effects, monitoring, drug-food interactions, safe handling, storage, and disposal. ? ?Patient will take Nubeqa '300mg'$  tablets and will take 2 tablets ('600mg'$ ) by mouth twice daily with a meal. Prednisone prescription was discontinued per MD as patient will not be taking other medication, zytiga. Patient states he did not pick up the medication and has not started it. ? ?Patient informed to avoid grapefruit and grapefruit juice while on medication. ? ?Nubeqa start date: 07/10/2021 ? ?Adverse effects include but are not limited to: decreased blood counts, increased LFTs, fatigue, GI toxicity, arthralgias, etc. ? ?Reviewed with patient importance of keeping a medication schedule and plan for any missed doses. No barriers to medication adherence identified. ? ?Medication reconciliation performed and medication/allergy list updated. ? ?Insurance authorization for [place] Haggard has been obtained. ?Patient will receive medication through  ? ?Patient informed the pharmacy will reach out 5-7 days prior to needing next fill of Nubeqa to coordinate continued medication acquisition to prevent break in therapy. ? ?All questions answered. ? ?Mr. Blakley voiced understanding and appreciation.  ? ?Medication education handout placed in mail for patient. Patient knows to call the office with questions or concerns. Oral Chemotherapy Clinic phone number provided to patient.  ? ?Drema Halon, PharmD ?Hematology/Oncology Clinical Pharmacist ?[facility] ?[phone] ?07/09/2021   9:55 AM ?

## 2021-07-09 NOTE — Telephone Encounter (Signed)
LM with note below 

## 2021-07-09 NOTE — Telephone Encounter (Signed)
-----   Message from Wyatt Portela, MD sent at 07/09/2021  8:36 AM EDT ----- ?Please let him know his PSA is down ?

## 2021-07-10 ENCOUNTER — Inpatient Hospital Stay: Payer: Medicaid Other

## 2021-08-05 ENCOUNTER — Encounter: Payer: Self-pay | Admitting: Oncology

## 2021-08-05 DIAGNOSIS — Z95828 Presence of other vascular implants and grafts: Secondary | ICD-10-CM | POA: Insufficient documentation

## 2021-08-06 ENCOUNTER — Inpatient Hospital Stay: Payer: Medicaid Other | Attending: Oncology

## 2021-08-06 ENCOUNTER — Inpatient Hospital Stay (HOSPITAL_BASED_OUTPATIENT_CLINIC_OR_DEPARTMENT_OTHER): Payer: Medicaid Other | Admitting: Oncology

## 2021-08-06 VITALS — BP 116/76 | HR 87 | Temp 97.9°F | Resp 18 | Ht 75.0 in | Wt 192.5 lb

## 2021-08-06 DIAGNOSIS — Z95828 Presence of other vascular implants and grafts: Secondary | ICD-10-CM

## 2021-08-06 DIAGNOSIS — C7951 Secondary malignant neoplasm of bone: Secondary | ICD-10-CM | POA: Diagnosis present

## 2021-08-06 DIAGNOSIS — C61 Malignant neoplasm of prostate: Secondary | ICD-10-CM | POA: Diagnosis present

## 2021-08-06 DIAGNOSIS — N1330 Unspecified hydronephrosis: Secondary | ICD-10-CM | POA: Insufficient documentation

## 2021-08-06 DIAGNOSIS — Z79899 Other long term (current) drug therapy: Secondary | ICD-10-CM | POA: Insufficient documentation

## 2021-08-06 DIAGNOSIS — E291 Testicular hypofunction: Secondary | ICD-10-CM | POA: Diagnosis not present

## 2021-08-06 LAB — CMP (CANCER CENTER ONLY)
ALT: 8 U/L (ref 0–44)
AST: 13 U/L — ABNORMAL LOW (ref 15–41)
Albumin: 3.7 g/dL (ref 3.5–5.0)
Alkaline Phosphatase: 104 U/L (ref 38–126)
Anion gap: 6 (ref 5–15)
BUN: 23 mg/dL — ABNORMAL HIGH (ref 6–20)
CO2: 27 mmol/L (ref 22–32)
Calcium: 9 mg/dL (ref 8.9–10.3)
Chloride: 107 mmol/L (ref 98–111)
Creatinine: 1.24 mg/dL (ref 0.61–1.24)
GFR, Estimated: >60 mL/min (ref >60)
Glucose, Bld: 142 mg/dL — ABNORMAL HIGH (ref 70–99)
Potassium: 3.6 mmol/L (ref 3.5–5.1)
Sodium: 140 mmol/L (ref 135–145)
Total Bilirubin: 0.3 mg/dL (ref 0.3–1.2)
Total Protein: 6.7 g/dL (ref 6.5–8.1)

## 2021-08-06 LAB — CBC WITH DIFFERENTIAL (CANCER CENTER ONLY)
Abs Immature Granulocytes: 0.02 10*3/uL (ref 0.00–0.07)
Basophils Absolute: 0 10*3/uL (ref 0.0–0.1)
Basophils Relative: 1 %
Eosinophils Absolute: 0.5 10*3/uL (ref 0.0–0.5)
Eosinophils Relative: 9 %
HCT: 27.2 % — ABNORMAL LOW (ref 39.0–52.0)
Hemoglobin: 8.6 g/dL — ABNORMAL LOW (ref 13.0–17.0)
Immature Granulocytes: 0 %
Lymphocytes Relative: 25 %
Lymphs Abs: 1.3 10*3/uL (ref 0.7–4.0)
MCH: 26.2 pg (ref 26.0–34.0)
MCHC: 31.6 g/dL (ref 30.0–36.0)
MCV: 82.9 fL (ref 80.0–100.0)
Monocytes Absolute: 0.6 10*3/uL (ref 0.1–1.0)
Monocytes Relative: 11 %
Neutro Abs: 2.8 10*3/uL (ref 1.7–7.7)
Neutrophils Relative %: 54 %
Platelet Count: 319 10*3/uL (ref 150–400)
RBC: 3.28 MIL/uL — ABNORMAL LOW (ref 4.22–5.81)
RDW: 16.6 % — ABNORMAL HIGH (ref 11.5–15.5)
WBC Count: 5.2 10*3/uL (ref 4.0–10.5)
nRBC: 0 % (ref 0.0–0.2)

## 2021-08-06 MED ORDER — HEPARIN SOD (PORK) LOCK FLUSH 100 UNIT/ML IV SOLN
500.0000 [IU] | Freq: Once | INTRAVENOUS | Status: AC
  Administered 2021-08-06: 500 [IU]

## 2021-08-06 MED ORDER — SODIUM CHLORIDE 0.9% FLUSH
10.0000 mL | Freq: Once | INTRAVENOUS | Status: AC
  Administered 2021-08-06: 10 mL

## 2021-08-06 NOTE — Progress Notes (Signed)
Hematology and Oncology Follow Up Visit ? ?Shane Jackson ?[id number] ?[birth year] 56 y.o. ?08/06/2021 3:30 PM ?Connye Burkitt, MD  ? 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  He presented with castration-sensitive disease and PSA of 1234. ? ? ?Prior Therapy: ? ?He is status post Port-A-Cath insertion on 03/04/2021. ? ?Firmagon 240 mg started on March 06, 2021.   ? ?Taxotere chemotherapy 75 mg/m? started on March 25, 2021.  He completed 6 cycles of therapy on July 08, 2021. ?Current therapy:  ? ?Nubeqa 600 mg twice a day started on July 09, 2021. ? ?Eligard 30 mg every 4 months started on May 27, 2021.  Next injection will be given in June 2023. ? ? ? ?Interim History: Shane Jackson is here for a follow-up visit.  Since the last visit, he completed the last cycle of chemotherapy without any major complications.  He also started [place] without any noted side effects.  He denies excessive fatigue, tiredness or weakness.  He denies any lethargy or bone pain.  His performance status and quality of life remain excellent. ? ?  ? ? ?Medications: Reviewed without changes.  ?Current Outpatient Medications  ?Medication Sig Dispense Refill  ? calcium-vitamin D (OSCAL WITH D) 500-5 MG-MCG tablet Take 1 tablet by mouth 2 (two) times daily. 60 tablet 3  ? darolutamide (NUBEQA) 300 MG tablet Take 2 tablets (600 mg total) by mouth 2 (two) times daily with a meal. 120 tablet 1  ? ferrous sulfate 325 (65 FE) MG tablet Take 1 tablet (325 mg total) by mouth daily. Start by taking one tablet every other day. Over the next 2 weeks increase your dose to once daily. 30 tablet 0  ? lidocaine-prilocaine (EMLA) cream Apply 1 application topically as needed. 30 g 0  ? prochlorperazine (COMPAZINE) 10 MG tablet Take 1 tablet (10 mg total) by mouth every 6 (six) hours as needed for nausea or vomiting. 30 tablet 0  ? ?No current  facility-administered medications for this visit.  ? ? ? ?Allergies: No Known Allergies ? ?. ? ?Physical Exam: ? ?Blood pressure 116/76, pulse 87, temperature 97.9 ?F (36.6 ?C), temperature source Temporal, resp. rate 18, height '6\' 3"'$  (1.905 m), weight 192 lb 8 oz (87.3 kg), SpO2 100 %. ? ? ? ? ?ECOG: 1 ? ? ?General appearance: Alert, awake without any distress. ?Head: Atraumatic without abnormalities ?Oropharynx: Without any thrush or ulcers. ?Eyes: No scleral icterus. ?Lymph nodes: No lymphadenopathy noted in the cervical, supraclavicular, or axillary nodes ?Heart:regular rate and rhythm, without any murmurs or gallops.   ?Lung: Clear to auscultation without any rhonchi, wheezes or dullness to percussion. ?Abdomin: Soft, nontender without any shifting dullness or ascites. ?Musculoskeletal: No clubbing or cyanosis. ?Neurological: No motor or sensory deficits. ?Skin: No rashes or lesions. ?She is a candidate ? ? ? ? ? ? ? ?Lab Results: ?Lab Results  ?Component Value Date  ? WBC 5.2 08/06/2021  ? HGB 8.6 (L) 08/06/2021  ? HCT 27.2 (L) 08/06/2021  ? MCV 82.9 08/06/2021  ? PLT 319 08/06/2021  ? ?  Chemistry   ?   ?Component Value Date/Time  ? NA 139 07/08/2021 0810  ? NA 139 02/18/2021 1420  ? K 4.0 07/08/2021 0810  ? CL 107 07/08/2021 0810  ? CO2 26 07/08/2021 0810  ? BUN 20 07/08/2021 0810  ? BUN 22 02/18/2021 1420  ? CREATININE 1.25 (H) 07/08/2021 0810  ?    ?  Component Value Date/Time  ? CALCIUM 9.5 07/08/2021 0810  ? ALKPHOS 118 07/08/2021 0810  ? AST 12 (L) 07/08/2021 0810  ? ALT 8 07/08/2021 0810  ? BILITOT 0.3 07/08/2021 0810  ?  ? ? Latest Reference Range & Units 05/04/21 10:39 05/27/21 08:26 06/17/21 08:11 07/08/21 08:10  ?Prostate Specific Ag, Serum 0.0 - 4.0 ng/mL 10.7 (H) 11.2 (H) 10.8 (H) 9.7 (H)  ?(H): Data is abnormally high ? ? ?Impression and Plan: ? ?56 year old with: ? ?1.  Advanced prostate cancer with disease to the bone and lymphadenopathy diagnosed in October 2022.  He presented with  castration-sensitive disease. ? ?He completed 6 cycles of Taxotere chemotherapy and currently on Nubeqa.  Risks and benefits of continuing this treatment were reviewed at this time.  Complications that include but fatigue, hypertension and urinary complaints were reiterated.  He is agreeable to continue at this time we will continue to monitor his PSA.  Different salvage therapy options including Jevtana chemotherapy or Pluvicto will be deferred. ?  ?2.  Androgen deprivation: He is currently on Eligard and will receive the next injection in June 2023. ?  ?3.  IV access: Port-A-Cath will continue be flushed periodically. ?  ? ?4.  Bone directed therapy: He is currently on calcium and vitamin D supplements.  [place] has been deferred till he obtains dental clearance. ?  ?5.  Prognosis and goals of care: Therapy is palliative at this time but aggressive measures are warranted given his excellent performance status. ?  ?6.  Anemia: Related to malignancy and chemotherapy.  Hemoglobin remained stable. ?  ?7.  Hydronephrosis: His kidney function remains close to normal range.  This is related to his malignancy. ?  ?9.  Follow-up: He will return in the next 6 weeks for repeat evaluation. ?  ?30  minutes were dedicated to this visit.  The time was spent on reviewing laboratory data, disease status update and outlining future plan of care discussion. ? ? ? ?Zola Button, MD ?4/20/20233:30 PM ? ?

## 2021-08-07 ENCOUNTER — Telehealth: Payer: Self-pay | Admitting: *Deleted

## 2021-08-07 LAB — PROSTATE-SPECIFIC AG, SERUM (LABCORP): Prostate Specific Ag, Serum: 1.5 ng/mL (ref 0.0–4.0)

## 2021-08-07 NOTE — Telephone Encounter (Signed)
Notified of message below

## 2021-08-07 NOTE — Telephone Encounter (Signed)
-----   Message from Wyatt Portela, MD sent at 08/07/2021  3:40 PM EDT ----- ?Please let him know his PSA is down ?

## 2021-08-10 ENCOUNTER — Other Ambulatory Visit: Payer: Self-pay | Admitting: Oncology

## 2021-08-10 ENCOUNTER — Telehealth: Payer: Self-pay

## 2021-08-10 ENCOUNTER — Other Ambulatory Visit: Payer: Self-pay

## 2021-08-10 DIAGNOSIS — C61 Malignant neoplasm of prostate: Secondary | ICD-10-CM

## 2021-08-10 MED ORDER — DAROLUTAMIDE 300 MG PO TABS: 600.0000 mg | ORAL_TABLET | Freq: Two times a day (BID) | ORAL | 1 refills | Status: DC

## 2021-08-10 NOTE — Telephone Encounter (Signed)
Rx sent 

## 2021-08-10 NOTE — Telephone Encounter (Signed)
-----   Message from Hanley Hays, CPhT sent at 08/10/2021  9:32 AM EDT ----- ?Will you please send a new prescription for his Nubeq to [facility]? ?Thanks, ?Elizabeth ? ?

## 2021-09-10 ENCOUNTER — Telehealth: Payer: Self-pay | Admitting: Oncology

## 2021-09-10 NOTE — Telephone Encounter (Signed)
Called patient regarding upcoming appointment, left a voicemail. 

## 2021-09-23 ENCOUNTER — Other Ambulatory Visit: Payer: Self-pay

## 2021-09-23 ENCOUNTER — Inpatient Hospital Stay: Payer: Medicaid Other

## 2021-09-23 ENCOUNTER — Inpatient Hospital Stay: Payer: Medicaid Other | Attending: Oncology

## 2021-09-23 ENCOUNTER — Inpatient Hospital Stay (HOSPITAL_BASED_OUTPATIENT_CLINIC_OR_DEPARTMENT_OTHER): Payer: Medicaid Other | Admitting: Oncology

## 2021-09-23 DIAGNOSIS — C61 Malignant neoplasm of prostate: Secondary | ICD-10-CM

## 2021-09-23 DIAGNOSIS — Z5111 Encounter for antineoplastic chemotherapy: Secondary | ICD-10-CM | POA: Diagnosis present

## 2021-09-23 DIAGNOSIS — C7951 Secondary malignant neoplasm of bone: Secondary | ICD-10-CM | POA: Diagnosis present

## 2021-09-23 DIAGNOSIS — Z95828 Presence of other vascular implants and grafts: Secondary | ICD-10-CM

## 2021-09-23 DIAGNOSIS — Z79899 Other long term (current) drug therapy: Secondary | ICD-10-CM | POA: Insufficient documentation

## 2021-09-23 LAB — CBC WITH DIFFERENTIAL (CANCER CENTER ONLY)
Abs Immature Granulocytes: 0.01 10*3/uL (ref 0.00–0.07)
Basophils Absolute: 0.1 10*3/uL (ref 0.0–0.1)
Basophils Relative: 1 %
Eosinophils Absolute: 0.5 10*3/uL (ref 0.0–0.5)
Eosinophils Relative: 8 %
HCT: 36.1 % — ABNORMAL LOW (ref 39.0–52.0)
Hemoglobin: 11.4 g/dL — ABNORMAL LOW (ref 13.0–17.0)
Immature Granulocytes: 0 %
Lymphocytes Relative: 32 %
Lymphs Abs: 2 10*3/uL (ref 0.7–4.0)
MCH: 26 pg (ref 26.0–34.0)
MCHC: 31.6 g/dL (ref 30.0–36.0)
MCV: 82.2 fL (ref 80.0–100.0)
Monocytes Absolute: 0.6 10*3/uL (ref 0.1–1.0)
Monocytes Relative: 10 %
Neutro Abs: 3.1 10*3/uL (ref 1.7–7.7)
Neutrophils Relative %: 49 %
Platelet Count: 288 10*3/uL (ref 150–400)
RBC: 4.39 MIL/uL (ref 4.22–5.81)
RDW: 14.9 % (ref 11.5–15.5)
WBC Count: 6.4 10*3/uL (ref 4.0–10.5)
nRBC: 0 % (ref 0.0–0.2)

## 2021-09-23 LAB — CMP (CANCER CENTER ONLY)
ALT: 14 U/L (ref 0–44)
AST: 21 U/L (ref 15–41)
Albumin: 4.3 g/dL (ref 3.5–5.0)
Alkaline Phosphatase: 117 U/L (ref 38–126)
Anion gap: 8 (ref 5–15)
BUN: 24 mg/dL — ABNORMAL HIGH (ref 6–20)
CO2: 27 mmol/L (ref 22–32)
Calcium: 9.7 mg/dL (ref 8.9–10.3)
Chloride: 108 mmol/L (ref 98–111)
Creatinine: 1.35 mg/dL — ABNORMAL HIGH (ref 0.61–1.24)
GFR, Estimated: >60 mL/min (ref >60)
Glucose, Bld: 83 mg/dL (ref 70–99)
Potassium: 4.1 mmol/L (ref 3.5–5.1)
Sodium: 143 mmol/L (ref 135–145)
Total Bilirubin: 0.5 mg/dL (ref 0.3–1.2)
Total Protein: 7.7 g/dL (ref 6.5–8.1)

## 2021-09-23 MED ORDER — LEUPROLIDE ACETATE (4 MONTH) 30 MG ~~LOC~~ KIT
30.0000 mg | PACK | Freq: Once | SUBCUTANEOUS | Status: AC
  Administered 2021-09-23: 30 mg via SUBCUTANEOUS

## 2021-09-23 MED ORDER — DAROLUTAMIDE 300 MG PO TABS: 600.0000 mg | ORAL_TABLET | Freq: Two times a day (BID) | ORAL | 3 refills | Status: DC

## 2021-09-23 NOTE — Progress Notes (Signed)
Hematology and Oncology Follow Up Visit  Shane Jackson [id number] 1966-01-03 56 y.o. 09/23/2021 2:47 PM Pcp, Hoyle Sauer, MD   Principle Diagnosis: 56 year old man with castration-sensitive advanced prostate cancer with disease to the bone and lymphadenopathy diagnosed in October 2022.  He was found to have PSA of 1234 at the time of diagnosis.   Prior Therapy:  He is status post Port-A-Cath insertion on 03/04/2021.  Firmagon 240 mg started on March 06, 2021.    Taxotere chemotherapy 75 mg/m started on March 25, 2021.  He completed 6 cycles of therapy on July 08, 2021. Current therapy:   Nubeqa 600 mg twice a day started on July 09, 2021.  Eligard 30 mg every 4 months started on May 27, 2021.  He will receive injection today.    Interim History: Shane Jackson presents today for a follow-up visit.  Since last visit, he continues to do well without any recent complaints.  He tolerated Nubeqa without any recent complaints.  He denies any bone pain pathological fractures.  He denies any excessive fatigue, tiredness or weakness.  His performance status quality of life remain excellent.      Medications: Reviewed without changes.  Current Outpatient Medications  Medication Sig Dispense Refill   calcium-vitamin D (OSCAL WITH D) 500-5 MG-MCG tablet Take 1 tablet by mouth 2 (two) times daily. 60 tablet 3   darolutamide (NUBEQA) 300 MG tablet Take 2 tablets (600 mg total) by mouth 2 (two) times daily with a meal. 120 tablet 1   ferrous sulfate 325 (65 FE) MG tablet Take 1 tablet (325 mg total) by mouth daily. Start by taking one tablet every other day. Over the next 2 weeks increase your dose to once daily. 30 tablet 0   lidocaine-prilocaine (EMLA) cream Apply 1 application topically as needed. 30 g 0   prochlorperazine (COMPAZINE) 10 MG tablet Take 1 tablet (10 mg total) by mouth every 6 (six) hours as needed for nausea or vomiting. 30 tablet 0   No current  facility-administered medications for this visit.     Allergies: No Known Allergies  .  Physical Exam:  Blood pressure (!) 165/101, pulse 73, temperature (!) 97.3 F (36.3 C), temperature source Temporal, resp. rate 16, weight 192 lb 6.4 oz (87.3 kg), SpO2 100 %.     ECOG: 1    General appearance: Comfortable appearing without any discomfort Head: Normocephalic without any trauma Oropharynx: Mucous membranes are moist and pink without any thrush or ulcers. Eyes: Pupils are equal and round reactive to light. Lymph nodes: No cervical, supraclavicular, inguinal or axillary lymphadenopathy.   Heart:regular rate and rhythm.  S1 and S2 without leg edema. Lung: Clear without any rhonchi or wheezes.  No dullness to percussion. Abdomin: Soft, nontender, nondistended with good bowel sounds.  No hepatosplenomegaly. Musculoskeletal: No joint deformity or effusion.  Full range of motion noted. Neurological: No deficits noted on motor, sensory and deep tendon reflex exam. Skin: No petechial rash or dryness.  Appeared moist.          Lab Results: Lab Results  Component Value Date   WBC 6.4 09/23/2021   HGB 11.4 (L) 09/23/2021   HCT 36.1 (L) 09/23/2021   MCV 82.2 09/23/2021   PLT 288 09/23/2021     Chemistry      Component Value Date/Time   NA 140 08/06/2021 1501   NA 139 02/18/2021 1420   K 3.6 08/06/2021 1501   CL 107 08/06/2021 1501   CO2 27  08/06/2021 1501   BUN 23 (H) 08/06/2021 1501   BUN 22 02/18/2021 1420   CREATININE 1.24 08/06/2021 1501      Component Value Date/Time   CALCIUM 9.0 08/06/2021 1501   ALKPHOS 104 08/06/2021 1501   AST 13 (L) 08/06/2021 1501   ALT 8 08/06/2021 1501   BILITOT 0.3 08/06/2021 1501       Latest Reference Range & Units 05/27/21 08:26 06/17/21 08:11 07/08/21 08:10 08/06/21 15:01  Prostate Specific Ag, Serum 0.0 - 4.0 ng/mL 11.2 (H) 10.8 (H) 9.7 (H) 1.5  (H): Data is abnormally high  Impression and Plan:  56 year old  with:  1.  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    His disease status was updated at this time and treatment choices were reviewed.  He is currently on Nubeqa with excellent response and continuous decline in his PSA after Taxotere chemotherapy.  Risks and benefits of continuing this treatment were discussed at this time.  Alternative or additional treatment therapy including PARP inhibitor additional chemotherapy were also reviewed but will be deferred unless he has castration-resistant disease.   2.  Androgen deprivation: He will receive Eligard today and repeated in 4 months.  Complications that include weight gain and hot flashes among others were reiterated.   3.  IV access: Port-A-Cath will remain in place and will be flushed in 2 months.    4.  Bone directed therapy: Shane Jackson has been deferred till he obtains dental clearance.  Calcium and vitamin D will be continued.   5.  Prognosis and goals of care: Aggressive measures are warranted given his excellent performance status.  Treatment is palliative.   6.  Anemia: Resolved at this time after discontinuation of chemotherapy.   7.  Hydronephrosis: Creatinine clearance continues to be within normal range.  This is related to malignancy.   9.  Follow-up: In 2 months for a follow-up visit.   30  minutes were spent on this encounter.  The time was dedicated to reviewing laboratory data, disease status update and future plan of care discussion.    Zola Button, MD 6/7/20232:47 PM

## 2021-09-24 ENCOUNTER — Telehealth: Payer: Self-pay | Admitting: *Deleted

## 2021-09-24 LAB — PROSTATE-SPECIFIC AG, SERUM (LABCORP): Prostate Specific Ag, Serum: 0.7 ng/mL (ref 0.0–4.0)

## 2021-09-24 NOTE — Telephone Encounter (Signed)
Notified of message below

## 2021-09-24 NOTE — Telephone Encounter (Signed)
-----   Message from Wyatt Portela, MD sent at 09/24/2021  3:10 PM EDT ----- Please let him know his PSA is down

## 2021-09-25 ENCOUNTER — Other Ambulatory Visit: Payer: Self-pay | Admitting: Oncology

## 2021-09-25 DIAGNOSIS — C61 Malignant neoplasm of prostate: Secondary | ICD-10-CM

## 2021-09-28 ENCOUNTER — Telehealth: Payer: Self-pay

## 2021-09-28 ENCOUNTER — Telehealth: Payer: Self-pay | Admitting: Oncology

## 2021-09-28 ENCOUNTER — Telehealth: Payer: Self-pay | Admitting: Pharmacy Technician

## 2021-09-28 DIAGNOSIS — C61 Malignant neoplasm of prostate: Secondary | ICD-10-CM

## 2021-09-28 MED ORDER — DAROLUTAMIDE 300 MG PO TABS: 600.0000 mg | ORAL_TABLET | Freq: Two times a day (BID) | ORAL | 3 refills | Status: AC

## 2021-09-28 NOTE — Telephone Encounter (Signed)
Called pt per 6/12 inbasket , Patient was unavailable, a message with appt time and date was left with number on file.

## 2021-09-28 NOTE — Telephone Encounter (Signed)
Oral Oncology Patient Advocate Encounter   Called [facility] to check the status of Nubeqa. Representative stated that assistance is still available from [facility] pending receipt of rx to [employer], [place].   [facility] phone number [phone].   Prescription has been sent and will be processed by the pharmacy upon receipt.    Lady Deutscher, CPhT-Adv Pharmacy Patient Advocate Specialist [place] Patient Advocate Team Direct Number: [phone]  Fax: [phone]

## 2021-09-28 NOTE — Telephone Encounter (Signed)
Oral Chemotherapy Pharmacist Encounter  Prescription, Nubeqa, transferred from [place] in [place] to [facility] in [place].   Drema Halon, PharmD Hematology/Oncology Clinical Pharmacist [facility] [phone]

## 2021-10-08 ENCOUNTER — Telehealth: Payer: Self-pay | Admitting: Oncology

## 2021-10-08 NOTE — Telephone Encounter (Signed)
Called patient regarding upcoming August appointments, patient is notified. 

## 2021-11-09 ENCOUNTER — Other Ambulatory Visit: Payer: Self-pay

## 2021-11-09 ENCOUNTER — Inpatient Hospital Stay: Payer: Medicaid Other

## 2021-11-09 ENCOUNTER — Inpatient Hospital Stay: Payer: Medicaid Other | Attending: Oncology | Admitting: Licensed Clinical Social Worker

## 2021-11-09 ENCOUNTER — Encounter: Payer: Self-pay | Admitting: Licensed Clinical Social Worker

## 2021-11-09 ENCOUNTER — Other Ambulatory Visit: Payer: Self-pay | Admitting: Licensed Clinical Social Worker

## 2021-11-09 DIAGNOSIS — C61 Malignant neoplasm of prostate: Secondary | ICD-10-CM | POA: Diagnosis present

## 2021-11-09 DIAGNOSIS — Z95828 Presence of other vascular implants and grafts: Secondary | ICD-10-CM

## 2021-11-09 DIAGNOSIS — Z809 Family history of malignant neoplasm, unspecified: Secondary | ICD-10-CM

## 2021-11-09 LAB — CMP (CANCER CENTER ONLY)
ALT: 8 U/L (ref 0–44)
AST: 16 U/L (ref 15–41)
Albumin: 4.3 g/dL (ref 3.5–5.0)
Alkaline Phosphatase: 96 U/L (ref 38–126)
Anion gap: 3 — ABNORMAL LOW (ref 5–15)
BUN: 24 mg/dL — ABNORMAL HIGH (ref 6–20)
CO2: 28 mmol/L (ref 22–32)
Calcium: 9.4 mg/dL (ref 8.9–10.3)
Chloride: 109 mmol/L (ref 98–111)
Creatinine: 1.3 mg/dL — ABNORMAL HIGH (ref 0.61–1.24)
GFR, Estimated: >60 mL/min (ref >60)
Glucose, Bld: 88 mg/dL (ref 70–99)
Potassium: 4 mmol/L (ref 3.5–5.1)
Sodium: 140 mmol/L (ref 135–145)
Total Bilirubin: 0.6 mg/dL (ref 0.3–1.2)
Total Protein: 7.3 g/dL (ref 6.5–8.1)

## 2021-11-09 LAB — CBC WITH DIFFERENTIAL (CANCER CENTER ONLY)
Abs Immature Granulocytes: 0.02 10*3/uL (ref 0.00–0.07)
Basophils Absolute: 0.1 10*3/uL (ref 0.0–0.1)
Basophils Relative: 1 %
Eosinophils Absolute: 0.3 10*3/uL (ref 0.0–0.5)
Eosinophils Relative: 5 %
HCT: 36 % — ABNORMAL LOW (ref 39.0–52.0)
Hemoglobin: 11.6 g/dL — ABNORMAL LOW (ref 13.0–17.0)
Immature Granulocytes: 0 %
Lymphocytes Relative: 36 %
Lymphs Abs: 2.2 10*3/uL (ref 0.7–4.0)
MCH: 25.8 pg — ABNORMAL LOW (ref 26.0–34.0)
MCHC: 32.2 g/dL (ref 30.0–36.0)
MCV: 80 fL (ref 80.0–100.0)
Monocytes Absolute: 0.5 10*3/uL (ref 0.1–1.0)
Monocytes Relative: 8 %
Neutro Abs: 3 10*3/uL (ref 1.7–7.7)
Neutrophils Relative %: 50 %
Platelet Count: 270 10*3/uL (ref 150–400)
RBC: 4.5 MIL/uL (ref 4.22–5.81)
RDW: 14.9 % (ref 11.5–15.5)
WBC Count: 6.1 10*3/uL (ref 4.0–10.5)
nRBC: 0 % (ref 0.0–0.2)

## 2021-11-09 MED ORDER — SODIUM CHLORIDE 0.9% FLUSH
10.0000 mL | Freq: Once | INTRAVENOUS | Status: AC
  Administered 2021-11-09: 10 mL

## 2021-11-09 MED ORDER — HEPARIN SOD (PORK) LOCK FLUSH 100 UNIT/ML IV SOLN
500.0000 [IU] | Freq: Once | INTRAVENOUS | Status: AC
  Administered 2021-11-09: 500 [IU]

## 2021-11-09 NOTE — Progress Notes (Signed)
REFERRING PROVIDER: Wyatt Portela, MD Shane Jackson,  [place] [address]  PRIMARY PROVIDER:  Pcp, No  PRIMARY REASON FOR VISIT:  1. Malignant neoplasm of prostate ([place])      HISTORY OF PRESENT ILLNESS:   Shane Jackson, a 56 y.o. male, was seen for a [place] cancer genetics consultation at the request of Dr. Alen Blew due to a personal history of prostate cancer.  Shane Jackson presents to clinic today to discuss the possibility of a hereditary predisposition to cancer, genetic testing, and to further clarify his future cancer risks, as well as potential cancer risks for family members.   In 2022, at the age of 61, Shane Jackson was diagnosed with prostate cancer, stage IVB. This is being treated with chemotherapy and Eligard.   CANCER HISTORY:  Oncology History  Malignant neoplasm of prostate ([place])  02/20/2021 Initial Diagnosis   Malignant neoplasm of prostate ([place])   03/25/2021 -  Chemotherapy   Patient is on Treatment Plan : BREAST Docetaxel q21d     06/17/2021 Cancer Staging   Staging form: Prostate, AJCC 8th Edition - Clinical: Stage IVB (cTX, cNX, pM1c) - Signed by Wyatt Portela, MD on 06/17/2021     Past Medical History:  Diagnosis Date   Anemia    Cancer ([place])    Diabetes mellitus without complication ([place])    Hepatitis C    Hypertension     Past Surgical History:  Procedure Laterality Date   IR IMAGING GUIDED PORT INSERTION  03/04/2021    FAMILY HISTORY:  We obtained a detailed, 4-generation family history.  Significant diagnoses are listed below: Family History  Problem Relation Age of Onset   Cancer Maternal Aunt        unknown types   Cancer Maternal Uncle        unknown type   Shane Jackson has 1 full brother, 1 maternal half brother, 1 maternal half sister, none have had cancer. He has 3 nieces/nephews.  Shane Jackson mother passed at 73. Patient had 2 maternal uncles, 2 maternal aunts. Both aunts passed of cancer, unknown types. An uncle also has had  cancer, unknown type.  Shane Jackson father passed at a young age, he has no information about this side of the family.  Shane Jackson is unaware of previous family history of genetic testing for hereditary cancer risks. There is no reported Ashkenazi Jewish ancestry. There is no known consanguinity.    GENETIC COUNSELING ASSESSMENT: Shane Jackson is a 56 y.o. male with a personal history of metastatic prostate cancer which is somewhat suggestive of a hereditary cancer syndrome and predisposition to cancer. We, therefore, discussed and recommended the following at today's visit.   DISCUSSION: We discussed that approximately 10% of prostate cancer is hereditary. Most cases of hereditary prostate cancer are associated with BRCA1/BRCA2 genes, although there are other genes associated with hereditary cancer as well. Cancers and risks are gene specific. We discussed that testing is beneficial for several reasons including knowing about cancer risks, identifying potential screening and risk-reduction options that may be appropriate, and to understand if other family members could be at risk for cancer and allow them to undergo genetic testing.   We reviewed the characteristics, features and inheritance patterns of hereditary cancer syndromes. We also discussed genetic testing, including the appropriate family members to test, the process of testing, insurance coverage and turn-around-time for results. We discussed the implications of a negative, positive and/or variant of uncertain significant result. We recommended Shane Jackson pursue genetic testing for the [facility] CustomNext+RNA gene panel.   The CustomNext-Cancer+RNAinsight panel offered by [facility] includes sequencing and rearrangement analysis for the following 47 genes:  APC, ATM, AXIN2, BARD1, BMPR1A, BRCA1, BRCA2, BRIP1, CDH1, CDK4, CDKN2A, CHEK2, DICER1, EPCAM, GREM1, HOXB13, MEN1, MLH1, MSH2, MSH3, MSH6, MUTYH, NBN, NF1, NF2, NTHL1, PALB2, PMS2, POLD1,  POLE, PTEN, RAD51C, RAD51D, RECQL, RET, SDHA, SDHAF2, SDHB, SDHC, SDHD, SMAD4, SMARCA4, STK11, TP53, TSC1, TSC2, and VHL.  RNA data is routinely analyzed for use in variant interpretation for all genes.  Based on Shane Jackson's personal history of cancer, he meets medical criteria for genetic testing. Despite that he meets criteria, he may still have an out of pocket cost. We discussed that if his out of pocket cost for testing is over $100, the laboratory will call and confirm whether he wants to proceed with testing.  If the out of pocket cost of testing is less than $100 he will be billed by the genetic testing laboratory.   PLAN: After considering the risks, benefits, and limitations, Shane Jackson provided informed consent to pursue genetic testing and the blood sample was sent to [facility] for analysis of the CustomNext+RNA. Results should be available within approximately 2-3 weeks' time, at which point they will be disclosed by telephone to Shane Jackson, as will any additional recommendations warranted by these results. Shane Jackson will receive a summary of his genetic counseling visit and a copy of his results once available. This information will also be available in Epic.   Shane Jackson questions were answered to his satisfaction today. Our contact information was provided should additional questions or concerns arise. Thank you for the referral and allowing [place] to share in the care of your patient.   Faith Rogue, MS, [facility] Genetic Counselor [email or web address] Phone: [phone]  The patient was seen for a total of 20 minutes in face-to-face genetic counseling.  Dr. Grayland Ormond was available for discussion regarding this case.   _______________________________________________________________________ For Office Staff:  Number of people involved in session: 1 Was an Intern/ student involved with case: no

## 2021-11-10 LAB — PROSTATE-SPECIFIC AG, SERUM (LABCORP): Prostate Specific Ag, Serum: 0.7 ng/mL (ref 0.0–4.0)

## 2021-11-10 LAB — GENETIC SCREENING ORDER

## 2021-11-26 ENCOUNTER — Telehealth: Payer: Self-pay

## 2021-11-26 NOTE — Telephone Encounter (Signed)
Patient called to cancel appointments on 12/11/21. Patient is moving out of state. He will contact our office when he has established care in [place] and has name of provider.  Patient voiced appreciation for care received at [facility].

## 2021-12-10 ENCOUNTER — Other Ambulatory Visit: Payer: Self-pay

## 2021-12-10 ENCOUNTER — Ambulatory Visit: Payer: Self-pay | Admitting: Licensed Clinical Social Worker

## 2021-12-10 ENCOUNTER — Telehealth: Payer: Self-pay | Admitting: Licensed Clinical Social Worker

## 2021-12-10 ENCOUNTER — Encounter: Payer: Self-pay | Admitting: Licensed Clinical Social Worker

## 2021-12-10 DIAGNOSIS — Z1379 Encounter for other screening for genetic and chromosomal anomalies: Secondary | ICD-10-CM

## 2021-12-10 NOTE — Telephone Encounter (Signed)
I contacted Mr. Shane Jackson to discuss his genetic testing results. No pathogenic variants were identified in the 47 genes analyzed. Detailed clinic note to follow.   The test report has been scanned into EPIC and is located under the Molecular Pathology section of the Results Review tab.  A portion of the result report is included below for reference.      Shane Rogue, MS, [facility] Genetic Counselor [email or web address] Phone: [phone]

## 2021-12-10 NOTE — Progress Notes (Signed)
HPI:   Mr. Heatwole was previously seen in the [place] clinic due to a personal and family history of cancer and concerns regarding a hereditary predisposition to cancer. Please refer to our prior cancer genetics clinic note for more information regarding our discussion, assessment and recommendations, at the time. Mr. Hodapp recent genetic test results were disclosed to him, as were recommendations warranted by these results. These results and recommendations are discussed in more detail below.  CANCER HISTORY:  Oncology History  Malignant neoplasm of prostate ([place])  02/20/2021 Initial Diagnosis   Malignant neoplasm of prostate ([place])   03/25/2021 -  Chemotherapy   Patient is on Treatment Plan : BREAST Docetaxel q21d     06/17/2021 Cancer Staging   Staging form: Prostate, AJCC 8th Edition - Clinical: Stage IVB (cTX, cNX, pM1c) - Signed by Wyatt Portela, MD on 06/17/2021    Genetic Testing   Negative genetic testing. No pathogenic variants identified on the [facility] CustomNext+RNA panel. The report date is 12/09/2021.  The CustomNext-Cancer+RNAinsight panel offered by [facility] includes sequencing and rearrangement analysis for the following 47 genes:  APC, ATM, AXIN2, BARD1, BMPR1A, BRCA1, BRCA2, BRIP1, CDH1, CDK4, CDKN2A, CHEK2, DICER1, EPCAM, GREM1, HOXB13, MEN1, MLH1, MSH2, MSH3, MSH6, MUTYH, NBN, NF1, NF2, NTHL1, PALB2, PMS2, POLD1, POLE, PTEN, RAD51C, RAD51D, RECQL, RET, SDHA, SDHAF2, SDHB, SDHC, SDHD, SMAD4, SMARCA4, STK11, TP53, TSC1, TSC2, and VHL.  RNA data is routinely analyzed for use in variant interpretation for all genes.      FAMILY HISTORY:  We obtained a detailed, 4-generation family history.  Significant diagnoses are listed below: Family History  Problem Relation Age of Onset   Cancer Maternal Aunt        unknown types   Cancer Maternal Uncle        unknown type   Mr. Battershell has 1 full brother, 1 maternal half brother, 1 maternal half sister, none  have had cancer. He has 3 nieces/nephews.   Mr. Howton mother passed at 72. Patient had 2 maternal uncles, 2 maternal aunts. Both aunts passed of cancer, unknown types. An uncle also has had cancer, unknown type.   Mr. Laverdure father passed at a young age, he has no information about this side of the family.   Mr. Lor is unaware of previous family history of genetic testing for hereditary cancer risks. There is no reported Ashkenazi Jewish ancestry. There is no known consanguinity.       GENETIC TEST RESULTS:  The [facility] CustomNext+RNA Panel found no pathogenic mutations.  The CustomNext-Cancer+RNAinsight panel offered by [facility] includes sequencing and rearrangement analysis for the following 47 genes:  APC, ATM, AXIN2, BARD1, BMPR1A, BRCA1, BRCA2, BRIP1, CDH1, CDK4, CDKN2A, CHEK2, DICER1, EPCAM, GREM1, HOXB13, MEN1, MLH1, MSH2, MSH3, MSH6, MUTYH, NBN, NF1, NF2, NTHL1, PALB2, PMS2, POLD1, POLE, PTEN, RAD51C, RAD51D, RECQL, RET, SDHA, SDHAF2, SDHB, SDHC, SDHD, SMAD4, SMARCA4, STK11, TP53, TSC1, TSC2, and VHL.  RNA data is routinely analyzed for use in variant interpretation for all genes.   The test report has been scanned into EPIC and is located under the Molecular Pathology section of the Results Review tab.  A portion of the result report is included below for reference. Genetic testing reported out on 12/09/2021.     Even though a pathogenic variant was not identified, possible explanations for the cancer in the family may include: There may be no hereditary risk for cancer in the family. The cancers in Mr. Norgard and/or his family may  be sporadic/familial or due to other genetic and environmental factors. There may be a gene mutation in one of these genes that current testing methods cannot detect but that chance is small. There could be another gene that has not yet been discovered, or that we have not yet tested, that is responsible for the cancer diagnoses in the family.  It  is also possible there is a hereditary cause for the cancer in the family that Mr. Cisnero did not inherit.  Therefore, it is important to remain in touch with cancer genetics in the future so that we can continue to offer Mr. Mino the most up to date genetic testing.   ADDITIONAL GENETIC TESTING:  We discussed with Mr. Joslyn that his genetic testing was fairly extensive.  If there are additional relevant genes identified to increase cancer risk that can be analyzed in the future, we would be happy to discuss and coordinate this testing at that time.    We discussed with Mr. Tassinari that there are other genes that are associated with increased cancer risk that can be analyzed. Should Mr. Bearce wish to pursue additional genetic testing, we are happy to discuss and coordinate this testing, at any time.  CANCER SCREENING RECOMMENDATIONS:  Mr. Tino test result is considered negative (normal).  This means that we have not identified a hereditary cause for his personal and family history of cancer at this time.   An individual's cancer risk and medical management are not determined by genetic test results alone. Overall cancer risk assessment incorporates additional factors, including personal medical history, family history, and any available genetic information that may result in a personalized plan for cancer prevention and surveillance. Therefore, it is recommended he continue to follow the cancer management and screening guidelines provided by his oncology and primary healthcare provider.  RECOMMENDATIONS FOR FAMILY MEMBERS:   Since he did not inherit a identifiable mutation in a cancer predisposition gene included on this panel, his children could not have inherited a known mutation from him in one of these genes. Individuals in this family might be at some increased risk of developing cancer, over the general population risk, due to the family history of cancer.  Individuals in the family should  notify their providers of the family history of cancer. We recommend women in this family have a yearly mammogram beginning at age 24, or 38 years younger than the earliest onset of cancer, an annual clinical breast exam, and perform monthly breast self-exams.  Family members should have colonoscopies by at age 23, or earlier, as recommended by their providers.  FOLLOW-UP:  Lastly, we discussed with Mr. Victory that cancer genetics is a rapidly advancing field and it is possible that new genetic tests will be appropriate for him and/or his family members in the future. We encouraged him to remain in contact with cancer genetics on an annual basis so we can update his personal and family histories and let him know of advances in cancer genetics that may benefit this family.   Our contact number was provided. Mr. Kohrs questions were answered to his satisfaction, and he knows he is welcome to call us at anytime with additional questions or concerns.    Faith Rogue, MS, [facility] Genetic Counselor [email or web address] Phone: [phone]

## 2021-12-11 ENCOUNTER — Other Ambulatory Visit: Payer: Self-pay

## 2021-12-11 ENCOUNTER — Ambulatory Visit: Payer: Self-pay | Admitting: Oncology

## 2021-12-29 ENCOUNTER — Telehealth: Payer: Self-pay | Admitting: *Deleted

## 2021-12-29 NOTE — Telephone Encounter (Signed)
Mr Crum left a message requesting records to be sent to [facility] in [place]. Fax # [phone]. Request forwarded to Medical Records

## 2021-12-31 ENCOUNTER — Telehealth: Payer: Self-pay | Admitting: *Deleted

## 2021-12-31 NOTE — Telephone Encounter (Signed)
Copy of last visit note & latest labs faxed to [facility] per patient request, he is locating to [place].  Fax confirmation received.

## 2022-01-14 ENCOUNTER — Other Ambulatory Visit: Payer: Self-pay

## 2022-02-03 ENCOUNTER — Other Ambulatory Visit: Payer: Self-pay

## 2022-02-23 ENCOUNTER — Encounter: Payer: Self-pay | Admitting: Oncology

## 2022-10-27 IMAGING — US IR IMAGING GUIDED PORT INSERTION
1 series · 1 of 1 positions shown · non-contrast
Comparison: none

CLINICAL DATA: Prostate cancer, ACCESS FOR CHEMOTHERAPY

[Series 1: ir fluoro/shunt/fist · 1 of 1 slices shown]
[im 1/1]
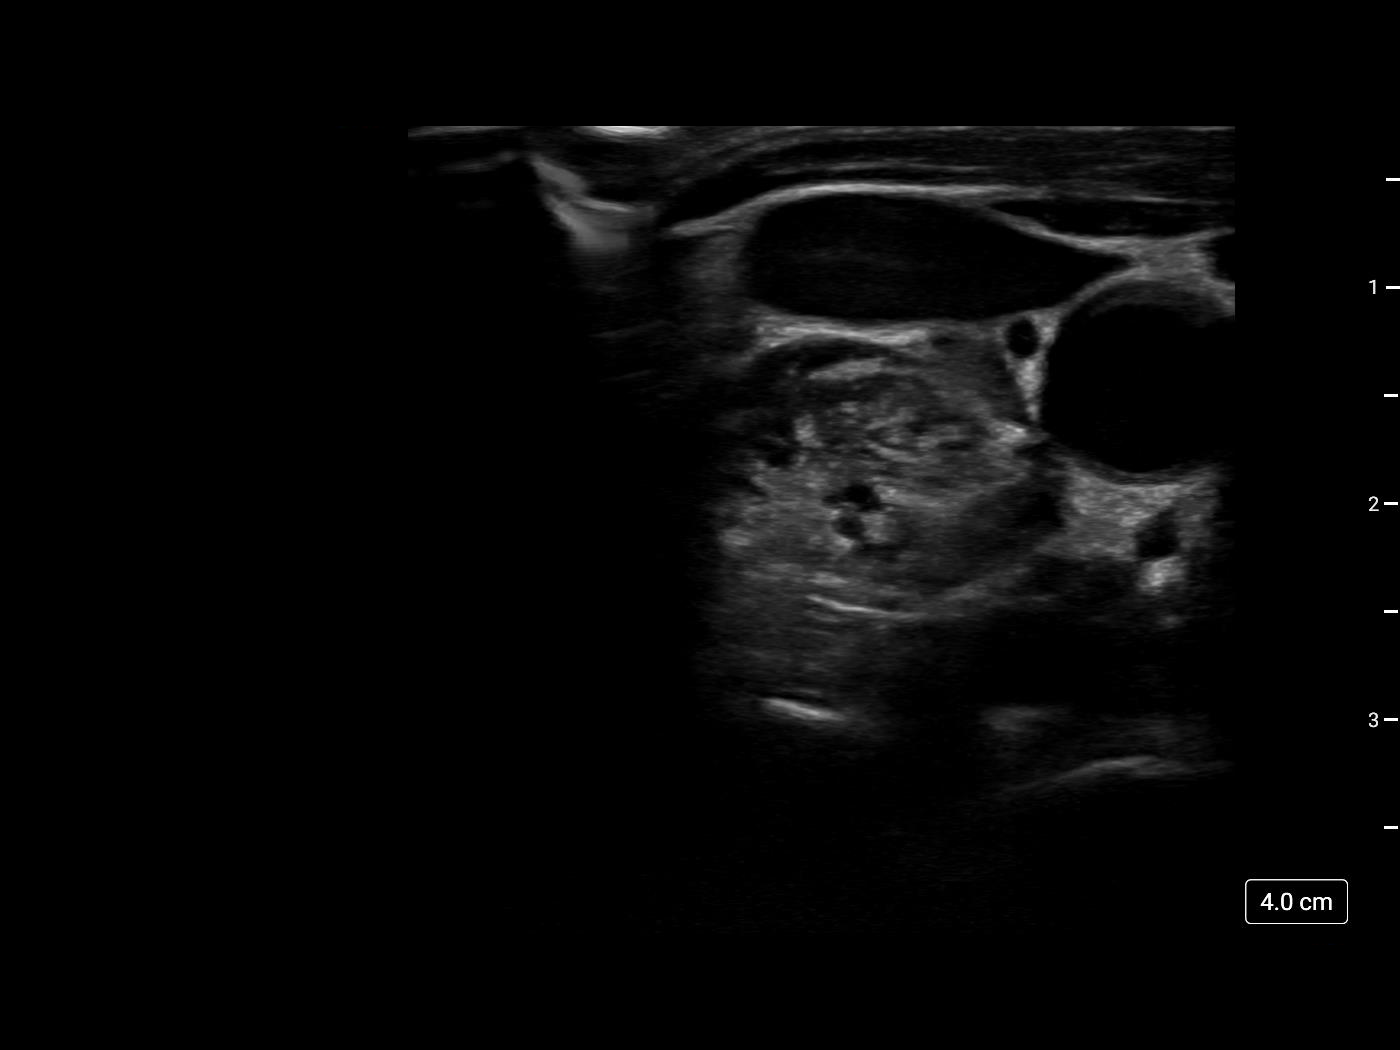

[1 of 1 positions shown; findings below may reference images not displayed]

EXAM:
RIGHT INTERNAL JUGULAR SINGLE LUMEN POWER PORT CATHETER INSERTION

Date:  03/04/2021 03/04/2021 [DATE]

Radiologist:  Peichev, Eitbari

Guidance:  Ultrasound and fluoroscopic

MEDICATIONS:
1% lidocaine local

ANESTHESIA/SEDATION:
Versed 4.0 mg IV; Fentanyl 100 mcg IV;

Moderate Sedation Time:  29 minute

The patient was continuously monitored during the procedure by the
interventional radiology nurse under my direct supervision.

FLUOROSCOPY TIME:  0 minutes, 24 seconds (1 mGy)

COMPLICATIONS:
None immediate.

CONTRAST:  None

PROCEDURE:
Informed consent was obtained from the patient following explanation
of the procedure, risks, benefits and alternatives. The patient
understands, agrees and consents for the procedure. All questions
were addressed. A time out was performed.

Maximal barrier sterile technique utilized including caps, mask,
sterile gowns, sterile gloves, large sterile drape, hand hygiene,
and 2% chlorhexidine scrub.

Under sterile conditions and local anesthesia, right internal
jugular micropuncture venous access was performed. Access was
performed with ultrasound. Images were obtained for documentation of
the patent right internal jugular vein. A guide wire was inserted
followed by a transitional dilator. This allowed insertion of a
guide wire and catheter into the IVC. Measurements were obtained
from the SVC / RA junction back to the right IJ venotomy site. In
the right infraclavicular chest, a subcutaneous pocket was created
over the second anterior rib. This was done under sterile conditions
and local anesthesia. 1% lidocaine with epinephrine was utilized for
this. A 2.5 cm incision was made in the skin. Blunt dissection was
performed to create a subcutaneous pocket over the right pectoralis
major muscle. The pocket was flushed with saline vigorously. There
was adequate hemostasis. The port catheter was assembled and checked
for leakage. The port catheter was secured in the pocket with two
retention sutures. The tubing was tunneled subcutaneously to the
right venotomy site and inserted into the SVC/RA junction through a
valved peel-away sheath. Position was confirmed with fluoroscopy.
Images were obtained for documentation. The patient tolerated the
procedure well. No immediate complications. Incisions were closed in
a two layer fashion with 4 - 0 Vicryl suture. Dermabond was applied
to the skin. The port catheter was accessed, blood was aspirated
followed by saline and heparin flushes. Needle was removed. A dry
sterile dressing was applied.
IMPRESSION: Ultrasound and fluoroscopically guided right internal jugular single
lumen power port catheter insertion. Tip in the SVC/RA junction.
Catheter ready for use.

## 2022-11-08 IMAGING — PT NM PET [PERSON_NAME] SKULL BASE T - THIGH
1 of 7 series · 1 of 25 positions shown · non-contrast
Comparison: CT 02/13/2021

CLINICAL DATA: Prostate carcinoma with biochemical recurrence.

EXAM:
NUCLEAR MEDICINE PET SKULL BASE TO THIGH
TECHNIQUE: 8.9 mCi F18 Piflufolastat (Pylarify) was injected intravenously.
Full-ring PET imaging was performed from the skull base to thigh
after the radiotracer. CT data was obtained and used for attenuation
correction and anatomic localization.

[Series 4: ct sk_thigh 5.0 bf37 · axial · 5.0mm · 0.98mm/px · 1 of 244 slices shown]
[im 183/244  brain]
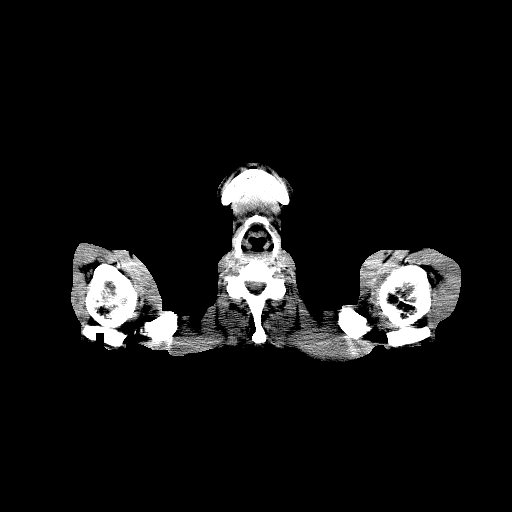

[1 of 25 positions shown; findings below may reference images not displayed]

FINDINGS: NECK

No radiotracer activity in neck lymph nodes.

Incidental CT finding: None

CHEST

No radiotracer accumulation within mediastinal or hilar lymph nodes.
No suspicious pulmonary nodules on the CT scan.

Incidental CT finding: None

ABDOMEN/PELVIS

Prostate: The prostate gland has intense radiotracer activity
throughout the gland with SUV max equal 15.9. There is expansion
beyond the prostatic capsule on the LEFT and RIGHT towards the
operator space is involvement of the Seminal vesicles. For example
extra prostatic tissue in the RIGHT seminal vesicle region with SUV
max equal 14.5. (Image 197)

Lymph nodes: Several radiotracer avid iliac lymph nodes are present.
For example RIGHT external iliac lymph node measuring 15 mm (image
194/CT series 4) with SUV max equal 9.8. LEFT external iliac lymph
node with SUV max equal 11.4 on image 192.

Liver: No evidence of liver metastasis

Incidental CT finding: There is severe RIGHT hydronephrosis and
hydroureter. Hydroureter extends to the level of the RIGHT iliac
vessels. Favor obstruction of the ureter by direct extension from
the extra capsular prostate carcinoma. No excretion of radiotracer
activity by the RIGHT kidney.

SKELETON

Multiple foci of intense radiotracer activity in the axillary and
appendicular skeleton. Intense radiotracer activity is accompanied
by sclerotic lesions on the CT portion exam. For example focal
lesion in the L 3 vertebral body with SUV max equal 19.5. Focal
lesion at L1 vertebral body SUV max equal 13. Several broad lesions
involve the RIGHT iliac bone. For example, posterior RIGHT iliac
bone lesion measuring 4 cm SUV max equal 17. Focal lesion in the
neck of the LEFT femur with SUV max 14 point. Multiple spine
lesions, proximal humeri lesions ,sternal lesion and cervical spine
lesions. Estimated 40 lesions.
IMPRESSION: 1. Intense metabolic activity within the prostate gland consistent
with primary prostate carcinoma.
2. Evidence of extracapsular spread of carcinoma within LEFT and
RIGHT pelvis.
3. Obstruction of the RIGHT ureter by extracapsular prostate
carcinoma extension. Severe RIGHT hydronephrosis and hydroureter.
4. Radiotracer avid metastatic adenopathy within the pelvis.
5. Multifocal intensely radiotracer avid skeletal metastasis within
the axillary appendicular skeleton. Approximately 40 lesions.
6. No evidence of liver metastasis or pulmonary metastasis.

## 2022-12-10 ENCOUNTER — Other Ambulatory Visit: Payer: Self-pay

## 2023-01-18 ENCOUNTER — Other Ambulatory Visit: Payer: Self-pay

## 2023-05-21 DEATH — deceased
